# Patient Record
Sex: Male | Born: 1985 | Race: Black or African American | Hispanic: No | Marital: Single | Smoking: Never smoker
Health system: Southern US, Community
[De-identification: ages and names within clinical notes are randomized; demographics above are authoritative.]

## PROBLEM LIST (undated history)

## (undated) DIAGNOSIS — E785 Hyperlipidemia, unspecified: Secondary | ICD-10-CM

## (undated) DIAGNOSIS — F209 Schizophrenia, unspecified: Secondary | ICD-10-CM

## (undated) HISTORY — DX: Schizophrenia, unspecified: F20.9

## (undated) HISTORY — DX: Hyperlipidemia, unspecified: E78.5

---
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
## 2006-06-04 DIAGNOSIS — E785 Hyperlipidemia, unspecified: Secondary | ICD-10-CM | POA: Insufficient documentation

## 2006-06-04 DIAGNOSIS — F209 Schizophrenia, unspecified: Secondary | ICD-10-CM | POA: Insufficient documentation

## 2006-06-04 DIAGNOSIS — E669 Obesity, unspecified: Secondary | ICD-10-CM

## 2013-04-07 DIAGNOSIS — E785 Hyperlipidemia, unspecified: Secondary | ICD-10-CM

## 2013-04-07 HISTORY — DX: Hyperlipidemia, unspecified: E78.5

## 2013-12-23 ENCOUNTER — Ambulatory Visit: Payer: Self-pay | Attending: Internal Medicine

## 2013-12-30 ENCOUNTER — Ambulatory Visit: Payer: Medicaid Other

## 2013-12-30 ENCOUNTER — Encounter: Payer: Self-pay | Admitting: Family Medicine

## 2013-12-30 ENCOUNTER — Ambulatory Visit: Payer: Medicaid Other | Attending: Family Medicine | Admitting: Family Medicine

## 2013-12-30 VITALS — BP 109/72 | HR 73 | Temp 97.7°F | Resp 18 | Ht 63.0 in | Wt 157.0 lb

## 2013-12-30 DIAGNOSIS — F2089 Other schizophrenia: Secondary | ICD-10-CM | POA: Diagnosis not present

## 2013-12-30 DIAGNOSIS — F17200 Nicotine dependence, unspecified, uncomplicated: Secondary | ICD-10-CM

## 2013-12-30 DIAGNOSIS — F209 Schizophrenia, unspecified: Secondary | ICD-10-CM

## 2013-12-30 DIAGNOSIS — E785 Hyperlipidemia, unspecified: Secondary | ICD-10-CM

## 2013-12-30 LAB — CBC
HCT: 43.5 % (ref 39.0–52.0)
Hemoglobin: 15.1 g/dL (ref 13.0–17.0)
MCH: 27.8 pg (ref 26.0–34.0)
MCHC: 34.7 g/dL (ref 30.0–36.0)
MCV: 80 fL (ref 78.0–100.0)
Platelets: 171 10*3/uL (ref 150–400)
RBC: 5.44 MIL/uL (ref 4.22–5.81)
RDW: 13.9 % (ref 11.5–15.5)
WBC: 4.1 10*3/uL (ref 4.0–10.5)

## 2013-12-30 NOTE — Progress Notes (Signed)
Establish Care;

## 2013-12-30 NOTE — Patient Instructions (Addendum)
Mr. Laurich,  Thank you for coming in today. It was a pleasure meeting you. I look forward to being your primary doctor.  1. I have placed a referral to psychiatry. You can also call to the numbers provided. Please call with the doses of your medications.   2. Smoking cessation support: smoking cessation hotline: 1-800-QUIT-NOW.  Smoking cessation classes are available through [facility]. Call [phone] or visit our website at [email or web address].   You will be called with lab results   Dr. Armen Pickup

## 2013-12-30 NOTE — Assessment & Plan Note (Signed)
Referred to psychiatry. Patient's mother is to call in with doses of his medication.

## 2013-12-30 NOTE — Assessment & Plan Note (Signed)
Current smoker not ready to quit. Counseling and smoking cessation resources provided.

## 2013-12-30 NOTE — Progress Notes (Signed)
   Subjective:    Patient ID: RAMZY CAPPELLETTI, male    DOB: [birth year], 28 y.o.   MRN: [id number] CC: Establish care, discussed smoking, referral to psychiatry for schizophrenia HPI 28 year old male with schizophrenia presents with his mother to establish care discussed the following:  #1 smoking: Patient currently smokes half pack per day. Patient's not amenable to cessation. Patient's mother wants him to quit. Patient's stepfather also smokes at home.  #2 schizophrenia: Patient was diagnosed with schizophrenia in his early teens. Patient is on medications but they do not recall the doses. Patient letter for her to psychiatry for continued mental health care. Patient was recently in prison. If he has any was at the psychiatric hospital for treatment of schizophrenia.  Healthcare maintenance: Patient refuses flu shot, Tdap, Pneumovax  Social history: Current smoker Review of Systems As per history of present illness    Objective:   Physical Exam BP 109/72  Pulse 73  Temp(Src) 97.7 F (36.5 C) (Oral)  Resp 18  Ht  (1.6 m)  Wt 157 lb (71.215 kg)  BMI 27.82 kg/m2  SpO2 97% Head: Normocephalic, without obvious abnormality, atraumatic Lungs: clear to auscultation bilaterally Heart: regular rate and rhythm, S1, S2 normal, no murmur, click, rub or gallop Extremities: extremities normal, atraumatic, no cyanosis or edema     Assessment & Plan:

## 2013-12-31 LAB — LIPID PANEL
Cholesterol: 181 mg/dL (ref 0–200)
HDL: 38 mg/dL — ABNORMAL LOW (ref >39)
LDL CALC: 106 mg/dL — AB (ref 0–99)
Total CHOL/HDL Ratio: 4.8 Ratio
Triglycerides: 184 mg/dL — ABNORMAL HIGH (ref <150)
VLDL: 37 mg/dL (ref 0–40)

## 2013-12-31 LAB — COMPLETE METABOLIC PANEL WITH GFR
ALT: 49 U/L (ref 0–53)
AST: 50 U/L — ABNORMAL HIGH (ref 0–37)
Albumin: 4 g/dL (ref 3.5–5.2)
Alkaline Phosphatase: 35 U/L — ABNORMAL LOW (ref 39–117)
BUN: 8 mg/dL (ref 6–23)
CO2: 27 mEq/L (ref 19–32)
Calcium: 9.2 mg/dL (ref 8.4–10.5)
Chloride: 104 mEq/L (ref 96–112)
Creat: 0.78 mg/dL (ref 0.50–1.35)
GFR, Est African American: >89 mL/min
GFR, Est Non African American: >89 mL/min
Glucose, Bld: 72 mg/dL (ref 70–99)
Potassium: 4.2 mEq/L (ref 3.5–5.3)
Sodium: 139 mEq/L (ref 135–145)
Total Bilirubin: 0.9 mg/dL (ref 0.2–1.2)
Total Protein: 7.3 g/dL (ref 6.0–8.3)

## 2014-01-04 ENCOUNTER — Telehealth: Payer: Self-pay | Admitting: *Deleted

## 2014-01-04 NOTE — Telephone Encounter (Signed)
Message copied by Dyann KiefGIRALDEZ, Waller Marcussen M on Wed Jan 04, 2014  5:30 PM ------      Message from: Dessa PhiFUNCHES, JOSALYN      Created: Tue Jan 03, 2014  2:23 PM       Normal CMP, CBC      Elevated cholesterol low fat, low carb, high fiber diet and regular exercise. ------

## 2014-01-04 NOTE — Telephone Encounter (Signed)
Left message with normal labs and advice to return call for more infromation

## 2014-04-28 ENCOUNTER — Encounter (HOSPITAL_COMMUNITY): Payer: Self-pay | Admitting: *Deleted

## 2014-04-28 ENCOUNTER — Emergency Department (HOSPITAL_COMMUNITY)
Admission: EM | Admit: 2014-04-28 | Discharge: 2014-04-28 | Disposition: A | Discharge status: Home / Self Care | Payer: Medicaid Other | Attending: Emergency Medicine | Admitting: Emergency Medicine

## 2014-04-28 DIAGNOSIS — Z7951 Long term (current) use of inhaled steroids: Secondary | ICD-10-CM | POA: Insufficient documentation

## 2014-04-28 DIAGNOSIS — J029 Acute pharyngitis, unspecified: Secondary | ICD-10-CM | POA: Diagnosis present

## 2014-04-28 DIAGNOSIS — J069 Acute upper respiratory infection, unspecified: Secondary | ICD-10-CM | POA: Diagnosis not present

## 2014-04-28 LAB — RAPID STREP SCREEN (MED CTR MEBANE ONLY): Streptococcus, Group A Screen (Direct): NEGATIVE

## 2014-04-28 MED ORDER — ALBUTEROL SULFATE HFA 108 (90 BASE) MCG/ACT IN AERS
2.0000 | INHALATION_SPRAY | Freq: Once | RESPIRATORY_TRACT | Status: AC
  Administered 2014-04-28: 2 via RESPIRATORY_TRACT
  Filled 2014-04-28: qty 6.7

## 2014-04-28 MED ORDER — FLUTICASONE PROPIONATE 50 MCG/ACT NA SUSP: 2.0000 | Freq: Every day | NASAL | Status: DC

## 2014-04-28 NOTE — ED Provider Notes (Signed)
CSN: [id number]     Arrival date & time 04/28/14  2158 History   First MD Initiated Contact with Patient 04/28/14 2200     Chief Complaint  Patient presents with  . Sore Throat     (Consider location/radiation/quality/duration/timing/severity/associated sxs/prior Treatment) HPI Comments: 29 year old male presenting [facility] the homeless shelter complaining of sore throat and "difficulty breathing through his nose" 1 week. States he's had a dry cough that is worse when he goes into the cold. No fevers or wheezing. He has not tried any alleviating factors for his symptoms. He is not sure if anyone else at the homeless shelter is sick.  Patient is a 29 y.o. male presenting with pharyngitis. The history is provided by the patient.  Sore Throat Associated symptoms include congestion, coughing and a sore throat.    History reviewed. No pertinent past medical history. History reviewed. No pertinent past surgical history. History reviewed. No pertinent family history. History  Substance Use Topics  . Smoking status: Not on file  . Smokeless tobacco: Not on file  . Alcohol Use: Yes    Review of Systems  HENT: Positive for congestion and sore throat.   Respiratory: Positive for cough.   All other systems reviewed and are negative.     Allergies  Review of patient's allergies indicates no known allergies.  Home Medications   Prior to Admission medications   Medication Sig Start Date End Date Taking? Authorizing Provider  fluticasone (FLONASE) 50 MCG/ACT nasal spray Place 2 sprays into both nostrils daily. 04/28/14   Demica Zook M Laquinda Moller, PA-C   BP 124/80 mmHg  Pulse 70  Temp(Src) 98.1 F (36.7 C) (Oral)  Resp 16  SpO2 97% Physical Exam  Constitutional: He is oriented to person, place, and time. He appears well-developed and well-nourished. No distress.  HENT:  Head: Normocephalic and atraumatic.  Post oropharyngeal erythema without edema or exiting. Posterior. Nasal mucosal  edema. No sinus tenderness.  Eyes: Conjunctivae are normal.  Neck: Normal range of motion. Neck supple.  Cardiovascular: Normal rate, regular rhythm and normal heart sounds.   Pulmonary/Chest: Effort normal and breath sounds normal. He has no wheezes.  Musculoskeletal: Normal range of motion. He exhibits no edema.  Lymphadenopathy:    He has no cervical adenopathy.  Neurological: He is alert and oriented to person, place, and time.  Skin: Skin is warm and dry. He is not diaphoretic.  Psychiatric: He has a normal mood and affect. His behavior is normal.  Nursing note and vitals reviewed.   ED Course  Procedures (including critical care time) Labs Review Labs Reviewed  RAPID STREP SCREEN  CULTURE, GROUP A STREP    Imaging Review No results found.   EKG Interpretation None      MDM   Final diagnoses:  URI (upper respiratory infection)   Patient in no apparent distress. Afebrile, vital signs stable. Lungs clear. Discussed symptomatic treatment. Albuterol inhaler given, prescription for Flonase. Advised salt water gargles, nasal saline and coolness to affairs. Stable for discharge. Return precautions given. Patient states understanding of treatment care plan and is agreeable.  Kathrynn SpeedRobyn M Jakorian Marengo, PA-C 04/28/14 2245  Geoffery Lyonsouglas Delo, MD 04/28/14 2253

## 2014-04-28 NOTE — ED Notes (Signed)
Bed: [id number] Expected date:  Expected time:  Means of arrival:  Comments: 24M Sore throat

## 2014-04-28 NOTE — Discharge Instructions (Signed)
Use albuterol inhaler every 4-6 hours as needed for cough. Use nasal spray as directed along with nasal saline and salt water gargles.  Upper Respiratory Infection, Adult An upper respiratory infection (URI) is also sometimes known as the common cold. The upper respiratory tract includes the nose, sinuses, throat, trachea, and bronchi. Bronchi are the airways leading to the lungs. Most people improve within 1 week, but symptoms can last up to 2 weeks. A residual cough may last even longer.  CAUSES Many different viruses can infect the tissues lining the upper respiratory tract. The tissues become irritated and inflamed and often become very moist. Mucus production is also common. A cold is contagious. You can easily spread the virus to others by oral contact. This includes kissing, sharing a glass, coughing, or sneezing. Touching your mouth or nose and then touching a surface, which is then touched by another person, can also spread the virus. SYMPTOMS  Symptoms typically develop 1 to 3 days after you come in contact with a cold virus. Symptoms vary from person to person. They may include:  Runny nose.  Sneezing.  Nasal congestion.  Sinus irritation.  Sore throat.  Loss of voice (laryngitis).  Cough.  Fatigue.  Muscle aches.  Loss of appetite.  Headache.  Low-grade fever. DIAGNOSIS  You might diagnose your own cold based on familiar symptoms, since most people get a cold 2 to 3 times a year. Your caregiver can confirm this based on your exam. Most importantly, your caregiver can check that your symptoms are not due to another disease such as strep throat, sinusitis, pneumonia, asthma, or epiglottitis. Blood tests, throat tests, and X-rays are not necessary to diagnose a common cold, but they may sometimes be helpful in excluding other more serious diseases. Your caregiver will decide if any further tests are required. RISKS AND COMPLICATIONS  You may be at risk for a more severe  case of the common cold if you smoke cigarettes, have chronic heart disease (such as heart failure) or lung disease (such as asthma), or if you have a weakened immune system. The very young and very old are also at risk for more serious infections. Bacterial sinusitis, middle ear infections, and bacterial pneumonia can complicate the common cold. The common cold can worsen asthma and chronic obstructive pulmonary disease (COPD). Sometimes, these complications can require emergency medical care and may be life-threatening. PREVENTION  The best way to protect against getting a cold is to practice good hygiene. Avoid oral or hand contact with people with cold symptoms. Wash your hands often if contact occurs. There is no clear evidence that vitamin C, vitamin E, echinacea, or exercise reduces the chance of developing a cold. However, it is always recommended to get plenty of rest and practice good nutrition. TREATMENT  Treatment is directed at relieving symptoms. There is no cure. Antibiotics are not effective, because the infection is caused by a virus, not by bacteria. Treatment may include:  Increased fluid intake. Sports drinks offer valuable electrolytes, sugars, and fluids.  Breathing heated mist or steam (vaporizer or shower).  Eating chicken soup or other clear broths, and maintaining good nutrition.  Getting plenty of rest.  Using gargles or lozenges for comfort.  Controlling fevers with ibuprofen or acetaminophen as directed by your caregiver.  Increasing usage of your inhaler if you have asthma. Zinc gel and zinc lozenges, taken in the first 24 hours of the common cold, can shorten the duration and lessen the severity of symptoms. Pain medicines  may help with fever, muscle aches, and throat pain. A variety of non-prescription medicines are available to treat congestion and runny nose. Your caregiver can make recommendations and may suggest nasal or lung inhalers for other symptoms.  HOME  CARE INSTRUCTIONS   Only take over-the-counter or prescription medicines for pain, discomfort, or fever as directed by your caregiver.  Use a warm mist humidifier or inhale steam from a shower to increase air moisture. This may keep secretions moist and make it easier to breathe.  Drink enough water and fluids to keep your urine clear or pale yellow.  Rest as needed.  Return to work when your temperature has returned to normal or as your caregiver advises. You may need to stay home longer to avoid infecting others. You can also use a face mask and careful hand washing to prevent spread of the virus. SEEK MEDICAL CARE IF:   After the first few days, you feel you are getting worse rather than better.  You need your caregiver's advice about medicines to control symptoms.  You develop chills, worsening shortness of breath, or brown or red sputum. These may be signs of pneumonia.  You develop yellow or brown nasal discharge or pain in the face, especially when you bend forward. These may be signs of sinusitis.  You develop a fever, swollen neck glands, pain with swallowing, or white areas in the back of your throat. These may be signs of strep throat. SEEK IMMEDIATE MEDICAL CARE IF:   You have a fever.  You develop severe or persistent headache, ear pain, sinus pain, or chest pain.  You develop wheezing, a prolonged cough, cough up blood, or have a change in your usual mucus (if you have chronic lung disease).  You develop sore muscles or a stiff neck. Document Released: 09/17/2000 Document Revised: 06/16/2011 Document Reviewed: 06/29/2013 [facility] Patient Information 2015 [place], [place]. This information is not intended to replace advice given to you by your health care provider. Make sure you discuss any questions you have with your health care provider.  Cough, Adult  A cough is a reflex that helps clear your throat and airways. It can help heal the body or may be a reaction to an  irritated airway. A cough may only last 2 or 3 weeks (acute) or may last more than 8 weeks (chronic).  CAUSES Acute cough:  Viral or bacterial infections. Chronic cough:  Infections.  Allergies.  Asthma.  Post-nasal drip.  Smoking.  Heartburn or acid reflux.  Some medicines.  Chronic lung problems (COPD).  Cancer. SYMPTOMS   Cough.  Fever.  Chest pain.  Increased breathing rate.  High-pitched whistling sound when breathing (wheezing).  Colored mucus that you cough up (sputum). TREATMENT   A bacterial cough may be treated with antibiotic medicine.  A viral cough must run its course and will not respond to antibiotics.  Your caregiver may recommend other treatments if you have a chronic cough. HOME CARE INSTRUCTIONS   Only take over-the-counter or prescription medicines for pain, discomfort, or fever as directed by your caregiver. Use cough suppressants only as directed by your caregiver.  Use a cold steam vaporizer or humidifier in your bedroom or home to help loosen secretions.  Sleep in a semi-upright position if your cough is worse at night.  Rest as needed.  Stop smoking if you smoke. SEEK IMMEDIATE MEDICAL CARE IF:   You have pus in your sputum.  Your cough starts to worsen.  You cannot control your  cough with suppressants and are losing sleep.  You begin coughing up blood.  You have difficulty breathing.  You develop pain which is getting worse or is uncontrolled with medicine.  You have a fever. MAKE SURE YOU:   Understand these instructions.  Will watch your condition.  Will get help right away if you are not doing well or get worse. Document Released: 09/20/2010 Document Revised: 06/16/2011 Document Reviewed: 09/20/2010 [facility] Patient Information 2015 [place], [place]. This information is not intended to replace advice given to you by your health care provider. Make sure you discuss any questions you have with your health care  provider.

## 2014-04-28 NOTE — ED Notes (Signed)
Patient is alert and oriented x3.  He was given DC instructions and follow up visit instructions.  Patient gave verbal understanding.  He was DC ambulatory under his own power to home.  V/S stable.  He was not showing any signs of distress on DC 

## 2014-04-28 NOTE — ED Notes (Signed)
EDP at bedside  

## 2014-04-28 NOTE — ED Notes (Addendum)
[facility] due to c/o sore throat x 1 week Patient ambulatory from EMS bay without difficulty--steady gait Patient arrives alert and oriented x 4 Patient able to speak in full complete sentences without difficulty--handles secretions RR WNL--even and unlabored with equal rise and fall of chest Patient in NAD

## 2014-04-30 LAB — CULTURE, GROUP A STREP

## 2014-05-01 ENCOUNTER — Encounter: Payer: Self-pay | Admitting: Family Medicine

## 2015-05-22 DIAGNOSIS — Z139 Encounter for screening, unspecified: Secondary | ICD-10-CM

## 2015-06-04 NOTE — Congregational Nurse Program (Signed)
Congregational Nurse Program Note  Date of Encounter: 05/22/2015  Past Medical History: Past Medical History  Diagnosis Date  . Schizophrenia 1996     Encounter Details:     CNP Questionnaire - 05/22/15 2009    Patient Demographics   Is this a new or existing patient? New   Patient is considered a/an Not Applicable   Race African-American/Black   Patient Assistance   Location of Patient Assistance Not Applicable   Patient's financial/insurance status Low Income;Self-Pay   Uninsured Patient Yes   Interventions Counseled to make appt. with provider   Patient referred to apply for the following financial assistance [employer] assistance No   Assistance securing medications No   Educational health offerings Acute disease;Chronic disease;Hypertension   Encounter Details   Primary purpose of visit Education/Health Concerns   Was an Emergency Department visit averted? Not Applicable   Does patient have a medical provider? No   Patient referred to Clinic   Was a mental health screening completed? (GAINS tool) No   Does patient have dental issues? No   Does patient have vision issues? No   Since previous encounter, have you referred patient for abnormal blood pressure that resulted in a new diagnosis or medication change? No   Since previous encounter, have you referred patient for abnormal blood glucose that resulted in a new diagnosis or medication change? No   For Abstraction Use Only   Does patient have insurance? No       B/P check

## 2015-07-17 ENCOUNTER — Emergency Department (HOSPITAL_COMMUNITY)
Admission: EM | Admit: 2015-07-17 | Discharge: 2015-07-17 | Disposition: A | Discharge status: Home / Self Care | Payer: Medicaid Other | Attending: Emergency Medicine | Admitting: Emergency Medicine

## 2015-07-17 ENCOUNTER — Encounter (HOSPITAL_COMMUNITY): Payer: Self-pay | Admitting: *Deleted

## 2015-07-17 DIAGNOSIS — X58XXXA Exposure to other specified factors, initial encounter: Secondary | ICD-10-CM | POA: Insufficient documentation

## 2015-07-17 DIAGNOSIS — F209 Schizophrenia, unspecified: Secondary | ICD-10-CM | POA: Insufficient documentation

## 2015-07-17 DIAGNOSIS — Z79899 Other long term (current) drug therapy: Secondary | ICD-10-CM | POA: Diagnosis not present

## 2015-07-17 DIAGNOSIS — Y9389 Activity, other specified: Secondary | ICD-10-CM | POA: Insufficient documentation

## 2015-07-17 DIAGNOSIS — Y998 Other external cause status: Secondary | ICD-10-CM | POA: Diagnosis not present

## 2015-07-17 DIAGNOSIS — S3121XA Laceration without foreign body of penis, initial encounter: Secondary | ICD-10-CM | POA: Diagnosis present

## 2015-07-17 DIAGNOSIS — Y9289 Other specified places as the place of occurrence of the external cause: Secondary | ICD-10-CM | POA: Insufficient documentation

## 2015-07-17 DIAGNOSIS — Z7951 Long term (current) use of inhaled steroids: Secondary | ICD-10-CM | POA: Insufficient documentation

## 2015-07-17 MED ORDER — BACITRACIN ZINC 500 UNIT/GM EX OINT: 1.0000 "application " | TOPICAL_OINTMENT | Freq: Two times a day (BID) | CUTANEOUS | Status: DC

## 2015-07-17 MED ORDER — BACITRACIN ZINC 500 UNIT/GM EX OINT: TOPICAL_OINTMENT | Freq: Two times a day (BID) | CUTANEOUS | Status: DC

## 2015-07-17 NOTE — ED Notes (Signed)
Patient able to ambulate independently  
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
## 2015-07-17 NOTE — Discharge Instructions (Signed)
Read the information below.  Use the prescribed medication as directed.  Please discuss all new medications with your pharmacist.  You may return to the Emergency Department at any time for worsening condition or any new symptoms that concern you.    If you develop redness, swelling, pus draining from the wound, or fevers greater than 100.4, return to the ER immediately for a recheck.   Keep the area clean with soap and water only, do not use alcohol or hydrogen peroxide.  Use a thin layer of antibiotic ointment twice daily while healing.     Laceration Care, Adult A laceration is a cut that goes through all layers of the skin. The cut also goes into the tissue that is right under the skin. Some cuts heal on their own. Others need to be closed with stitches (sutures), staples, skin adhesive strips, or wound glue. Taking care of your cut lowers your risk of infection and helps your cut to heal better. HOW TO TAKE CARE OF YOUR CUT For stitches or staples:  Keep the wound clean and dry.  If you were given a bandage (dressing), you should change it at least one time per day or as told by your doctor. You should also change it if it gets wet or dirty.  Keep the wound completely dry for the first 24 hours or as told by your doctor. After that time, you may take a shower or a bath. However, make sure that the wound is not soaked in water until after the stitches or staples have been removed.  Clean the wound one time each day or as told by your doctor:  Wash the wound with soap and water.  Rinse the wound with water until all of the soap comes off.  Pat the wound dry with a clean towel. Do not rub the wound.  After you clean the wound, put a thin layer of antibiotic ointment on it as told by your doctor. This ointment:  Helps to prevent infection.  Keeps the bandage from sticking to the wound.  Have your stitches or staples removed as told by your doctor. If your doctor used skin adhesive  strips:   Keep the wound clean and dry.  If you were given a bandage, you should change it at least one time per day or as told by your doctor. You should also change it if it gets dirty or wet.  Do not get the skin adhesive strips wet. You can take a shower or a bath, but be careful to keep the wound dry.  If the wound gets wet, pat it dry with a clean towel. Do not rub the wound.  Skin adhesive strips fall off on their own. You can trim the strips as the wound heals. Do not remove any strips that are still stuck to the wound. They will fall off after a while. If your doctor used wound glue:  Try to keep your wound dry, but you may briefly wet it in the shower or bath. Do not soak the wound in water, such as by swimming.  After you take a shower or a bath, gently pat the wound dry with a clean towel. Do not rub the wound.  Do not do any activities that will make you really sweaty until the skin glue has fallen off on its own.  Do not apply liquid, cream, or ointment medicine to your wound while the skin glue is still on.  If you were given  a bandage, you should change it at least one time per day or as told by your doctor. You should also change it if it gets dirty or wet.  If a bandage is placed over the wound, do not let the tape for the bandage touch the skin glue.  Do not pick at the glue. The skin glue usually stays on for 5-10 days. Then, it falls off of the skin. General Instructions  To help prevent scarring, make sure to cover your wound with sunscreen whenever you are outside after stitches are removed, after adhesive strips are removed, or when wound glue stays in place and the wound is healed. Make sure to wear a sunscreen of at least 30 SPF.  Take over-the-counter and prescription medicines only as told by your doctor.  If you were given antibiotic medicine or ointment, take or apply it as told by your doctor. Do not stop using the antibiotic even if your wound is  getting better.  Do not scratch or pick at the wound.  Keep all follow-up visits as told by your doctor. This is important.  Check your wound every day for signs of infection. Watch for:  Redness, swelling, or pain.  Fluid, blood, or pus.  Raise (elevate) the injured area above the level of your heart while you are sitting or lying down, if possible. GET HELP IF:  You got a tetanus shot and you have any of these problems at the injection site:  Swelling.  Very bad pain.  Redness.  Bleeding.  You have a fever.  A wound that was closed breaks open.  You notice a bad smell coming from your wound or your bandage.  You notice something coming out of the wound, such as wood or glass.  Medicine does not help your pain.  You have more redness, swelling, or pain at the site of your wound.  You have fluid, blood, or pus coming from your wound.  You notice a change in the color of your skin near your wound.  You need to change the bandage often because fluid, blood, or pus is coming from the wound.  You start to have a new rash.  You start to have numbness around the wound. GET HELP RIGHT AWAY IF:  You have very bad swelling around the wound.  Your pain suddenly gets worse and is very bad.  You notice painful lumps near the wound or on skin that is anywhere on your body.  You have a red streak going away from your wound.  The wound is on your hand or foot and you cannot move a finger or toe like you usually can.  The wound is on your hand or foot and you notice that your fingers or toes look pale or bluish.   This information is not intended to replace advice given to you by your health care provider. Make sure you discuss any questions you have with your health care provider.   Document Released: 09/10/2007 Document Revised: 08/08/2014 Document Reviewed: 03/20/2014 Elsevier Interactive Patient Education [employer]
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
## 2015-09-11 ENCOUNTER — Encounter: Payer: Self-pay | Admitting: Family Medicine

## 2015-09-11 ENCOUNTER — Ambulatory Visit: Payer: Medicaid Other | Attending: Family Medicine | Admitting: Family Medicine

## 2015-09-11 VITALS — BP 105/69 | HR 59 | Temp 98.4°F | Resp 16 | Ht 66.5 in | Wt 179.0 lb

## 2015-09-11 DIAGNOSIS — E785 Hyperlipidemia, unspecified: Secondary | ICD-10-CM | POA: Diagnosis not present

## 2015-09-11 DIAGNOSIS — F209 Schizophrenia, unspecified: Secondary | ICD-10-CM

## 2015-09-11 DIAGNOSIS — J302 Other seasonal allergic rhinitis: Secondary | ICD-10-CM

## 2015-09-11 DIAGNOSIS — Z79899 Other long term (current) drug therapy: Secondary | ICD-10-CM | POA: Diagnosis not present

## 2015-09-11 DIAGNOSIS — Z114 Encounter for screening for human immunodeficiency virus [HIV]: Secondary | ICD-10-CM

## 2015-09-11 DIAGNOSIS — Z23 Encounter for immunization: Secondary | ICD-10-CM

## 2015-09-11 DIAGNOSIS — R0789 Other chest pain: Secondary | ICD-10-CM

## 2015-09-11 LAB — CBC
HCT: 45.4 % (ref 38.5–50.0)
HEMOGLOBIN: 15.3 g/dL (ref 13.2–17.1)
MCH: 27.4 pg (ref 27.0–33.0)
MCHC: 33.7 g/dL (ref 32.0–36.0)
MCV: 81.2 fL (ref 80.0–100.0)
MPV: 11.5 fL (ref 7.5–12.5)
Platelets: 158 10*3/uL (ref 140–400)
RBC: 5.59 MIL/uL (ref 4.20–5.80)
RDW: 14.5 % (ref 11.0–15.0)
WBC: 4.7 10*3/uL (ref 3.8–10.8)

## 2015-09-11 MED ORDER — CETIRIZINE HCL 10 MG PO TABS: 10.0000 mg | ORAL_TABLET | Freq: Every day | ORAL | Status: DC

## 2015-09-11 MED FILL — ALL DAY ALLERGY 10 MG TAB: 10 | 30 days supply | Qty: 30 | Fill #0
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
## 2015-09-11 NOTE — Progress Notes (Signed)
Annual physical  Pt non fasting, requesting lipid check  No pain today  No tobacco user  No suicidal thoughts in the past two weeks

## 2015-09-11 NOTE — Patient Instructions (Addendum)
Jorge Bentley was seen today for annual exam.  Diagnoses and all orders for this visit:  Hyperlipidemia -     Lipid Panel  Schizophrenia, unspecified type (HCC) -     CBC -     COMPLETE METABOLIC PANEL WITH GFR -     Valproic acid level  Screening for HIV (human immunodeficiency virus) -     HIV antibody (with reflex)  Left-sided chest wall pain -     DG Chest 2 View; Standing -     DG Chest 2 View   You will be contacted with your lab results  F/u in one year sooner if needed   Dr. Armen PickupFunches

## 2015-09-12 LAB — COMPLETE METABOLIC PANEL WITH GFR
ALBUMIN: 4.3 g/dL (ref 3.6–5.1)
ALK PHOS: 27 U/L — AB (ref 40–115)
ALT: 16 U/L (ref 9–46)
AST: 22 U/L (ref 10–40)
BILIRUBIN TOTAL: 1.2 mg/dL (ref 0.2–1.2)
BUN: 11 mg/dL (ref 7–25)
CO2: 29 mmol/L (ref 20–31)
CREATININE: 0.82 mg/dL (ref 0.60–1.35)
Calcium: 9.3 mg/dL (ref 8.6–10.3)
Chloride: 104 mmol/L (ref 98–110)
GFR, Est African American: >89 mL/min (ref >=60)
GFR, Est Non African American: >89 mL/min (ref >=60)
GLUCOSE: 80 mg/dL (ref 65–99)
Potassium: 4.7 mmol/L (ref 3.5–5.3)
SODIUM: 139 mmol/L (ref 135–146)
TOTAL PROTEIN: 6.8 g/dL (ref 6.1–8.1)

## 2015-09-12 LAB — VALPROIC ACID LEVEL: Valproic Acid Lvl: <12.5 ug/mL — ABNORMAL LOW (ref 50.0–100.0)

## 2015-09-12 LAB — LIPID PANEL
Cholesterol: 180 mg/dL (ref 125–200)
HDL: 55 mg/dL (ref >=40)
LDL CALC: 108 mg/dL (ref <130)
Total CHOL/HDL Ratio: 3.3 Ratio (ref <=5.0)
Triglycerides: 87 mg/dL (ref <150)
VLDL: 17 mg/dL (ref <30)

## 2015-09-12 LAB — HIV ANTIBODY (ROUTINE TESTING W REFLEX): HIV 1&2 Ab, 4th Generation: NONREACTIVE

## 2015-09-24 ENCOUNTER — Telehealth: Payer: Self-pay | Admitting: *Deleted

## 2015-09-24 NOTE — Telephone Encounter (Signed)
-----   Message from Dessa PhiJosalyn Funches, MD sent at 09/13/2015  8:15 AM EDT ----- All labs normal Patient valproic acid  level is low because he is not taking it  Referred to psychiatry for schizophrenia.

## 2015-09-24 NOTE — Telephone Encounter (Signed)
LVM to return call.

## 2016-08-16 ENCOUNTER — Encounter (HOSPITAL_COMMUNITY): Payer: Self-pay

## 2016-08-16 ENCOUNTER — Emergency Department (HOSPITAL_COMMUNITY)
Admission: EM | Admit: 2016-08-16 | Discharge: 2016-08-16 | Disposition: A | Discharge status: Home / Self Care | Payer: Medicaid Other | Attending: Emergency Medicine | Admitting: Emergency Medicine

## 2016-08-16 DIAGNOSIS — F10920 Alcohol use, unspecified with intoxication, uncomplicated: Secondary | ICD-10-CM

## 2016-08-16 DIAGNOSIS — F10120 Alcohol abuse with intoxication, uncomplicated: Secondary | ICD-10-CM | POA: Diagnosis present

## 2016-08-16 DIAGNOSIS — Z79899 Other long term (current) drug therapy: Secondary | ICD-10-CM | POA: Diagnosis not present

## 2016-08-16 NOTE — Discharge Instructions (Signed)
Please read and follow all provided instructions.  Your diagnoses today include:  1. Alcoholic intoxication without complication (HCC)     Tests performed today include: Vital signs. See below for your results today.   Medications prescribed:  Take as prescribed   Home care instructions:  Follow any educational materials contained in this packet.  Follow-up instructions: Please follow-up with your primary care provider for further evaluation of symptoms and treatment   Return instructions:  Please return to the Emergency Department if you do not get better, if you get worse, or new symptoms OR  - Fever (temperature greater than 101.67F)  - Bleeding that does not stop with holding pressure to the area    -Severe pain (please note that you may be more sore the day after your accident)  - Chest Pain  - Difficulty breathing  - Severe nausea or vomiting  - Inability to tolerate food and liquids  - Passing out  - Skin becoming red around your wounds  - Change in mental status (confusion or lethargy)  - New numbness or weakness    Please return if you have any other emergent concerns.  Additional Information:  Your vital signs today were: BP 108/64 (BP Location: Right Arm)    Pulse 73    Temp 97.8 F (36.6 C) (Oral)    Resp 16    Ht 5\' 7"  (1.702 m)    Wt 79.4 kg    SpO2 97%    BMI 27.41 kg/m  If your blood pressure (BP) was elevated above 135/85 this visit, please have this repeated by your doctor within one month. ---------------

## 2016-08-16 NOTE — ED Notes (Signed)
Bed: [facility] Expected date:  Expected time:  Means of arrival:  Comments: ETOH 
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
## 2016-08-16 NOTE — ED Notes (Signed)
Patient has water at the bedside, but opened eyes only for a few seconds when attempting to awaken.. When more alert and awake will encourage to drink.

## 2016-08-16 NOTE — ED Notes (Signed)
Patient has tolerated 2 sandwiches, peanut butter crackers and juice with no problem.

## 2016-12-23 ENCOUNTER — Emergency Department (HOSPITAL_COMMUNITY)
Admission: EM | Admit: 2016-12-23 | Discharge: 2016-12-24 | Disposition: A | Discharge status: Home / Self Care | Payer: Medicaid Other | Attending: Emergency Medicine | Admitting: Emergency Medicine

## 2016-12-23 ENCOUNTER — Emergency Department (HOSPITAL_COMMUNITY): Payer: Medicaid Other

## 2016-12-23 DIAGNOSIS — S0230XA Fracture of orbital floor, unspecified side, initial encounter for closed fracture: Secondary | ICD-10-CM | POA: Insufficient documentation

## 2016-12-23 DIAGNOSIS — S022XXA Fracture of nasal bones, initial encounter for closed fracture: Secondary | ICD-10-CM | POA: Insufficient documentation

## 2016-12-23 DIAGNOSIS — F209 Schizophrenia, unspecified: Secondary | ICD-10-CM | POA: Diagnosis not present

## 2016-12-23 DIAGNOSIS — S0590XA Unspecified injury of unspecified eye and orbit, initial encounter: Secondary | ICD-10-CM | POA: Diagnosis present

## 2016-12-23 DIAGNOSIS — Z79899 Other long term (current) drug therapy: Secondary | ICD-10-CM | POA: Diagnosis not present

## 2016-12-23 DIAGNOSIS — Y998 Other external cause status: Secondary | ICD-10-CM | POA: Diagnosis not present

## 2016-12-23 DIAGNOSIS — S0285XA Fracture of orbit, unspecified, initial encounter for closed fracture: Secondary | ICD-10-CM

## 2016-12-23 DIAGNOSIS — Y929 Unspecified place or not applicable: Secondary | ICD-10-CM | POA: Insufficient documentation

## 2016-12-23 DIAGNOSIS — Y9389 Activity, other specified: Secondary | ICD-10-CM | POA: Diagnosis not present

## 2016-12-23 DIAGNOSIS — S01112A Laceration without foreign body of left eyelid and periocular area, initial encounter: Secondary | ICD-10-CM

## 2016-12-23 DIAGNOSIS — Z23 Encounter for immunization: Secondary | ICD-10-CM | POA: Insufficient documentation

## 2016-12-23 MED ORDER — LIDOCAINE-EPINEPHRINE (PF) 2 %-1:200000 IJ SOLN
10.0000 mL | Freq: Once | INTRAMUSCULAR | Status: AC
  Administered 2016-12-24: 10 mL
  Filled 2016-12-23: qty 20

## 2016-12-23 MED ORDER — TETANUS-DIPHTH-ACELL PERTUSSIS 5-2.5-18.5 LF-MCG/0.5 IM SUSP
0.5000 mL | Freq: Once | INTRAMUSCULAR | Status: AC
  Administered 2016-12-23: 0.5 mL via INTRAMUSCULAR
  Filled 2016-12-23: qty 0.5
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
## 2016-12-23 NOTE — ED Notes (Signed)
Called pt's name for triage. No one answered. Nurse in triage was notified.

## 2016-12-23 NOTE — ED Notes (Signed)
Wound cleanser to face, remove some of the dried blood. No crepitus around nose and sinus areas. Patient says "blood was squirting out my eye". Right eye swollen, dried blood on lids. This area not cleaned, so as to not start bleeding again.

## 2016-12-23 NOTE — ED Notes (Signed)
Patient transported to CT 

## 2016-12-24 MED ORDER — HYDROCODONE-ACETAMINOPHEN 5-325 MG PO TABS: 1.0000 | ORAL_TABLET | Freq: Four times a day (QID) | ORAL | 0 refills | Status: AC | PRN

## 2016-12-24 NOTE — ED Notes (Signed)
Patient Alert and oriented X4. Stable and ambulatory. Patient verbalized understanding of the discharge instructions.  Patient belongings were taken by the patient.  

## 2016-12-24 NOTE — ED Notes (Signed)
Patient very lethargic, repetitive questioning and easily forgets things.  PA at bedside for lac repair and aware of patient's condition.

## 2016-12-24 NOTE — ED Notes (Signed)
Patient able to walk without assistance.  Patient denies any light headedness or vertigo.  Patient getting dressed to be discharged.

## 2016-12-24 NOTE — Discharge Instructions (Signed)
You need to follow-up with the plastic surgeon listed.  Your sutures need to be removed in 5 days.  Return for worsening symptoms.

## 2016-12-31 ENCOUNTER — Ambulatory Visit: Payer: Medicaid Other | Admitting: Physician Assistant

## 2016-12-31 ENCOUNTER — Encounter: Payer: Self-pay | Admitting: Physician Assistant

## 2016-12-31 VITALS — BP 106/66 | HR 88 | Temp 97.9°F | Wt 181.8 lb

## 2016-12-31 DIAGNOSIS — S0285XD Fracture of orbit, unspecified, subsequent encounter for fracture with routine healing: Secondary | ICD-10-CM

## 2016-12-31 DIAGNOSIS — F209 Schizophrenia, unspecified: Secondary | ICD-10-CM

## 2016-12-31 DIAGNOSIS — E785 Hyperlipidemia, unspecified: Secondary | ICD-10-CM

## 2016-12-31 DIAGNOSIS — F17200 Nicotine dependence, unspecified, uncomplicated: Secondary | ICD-10-CM

## 2016-12-31 DIAGNOSIS — Z4802 Encounter for removal of sutures: Secondary | ICD-10-CM
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
## 2017-10-06 ENCOUNTER — Emergency Department (HOSPITAL_COMMUNITY)
Admission: EM | Admit: 2017-10-06 | Discharge: 2017-10-06 | Disposition: A | Discharge status: Home / Self Care | Payer: Medicaid Other | Attending: Emergency Medicine | Admitting: Emergency Medicine

## 2017-10-06 ENCOUNTER — Encounter (HOSPITAL_COMMUNITY): Payer: Self-pay | Admitting: *Deleted

## 2017-10-06 ENCOUNTER — Other Ambulatory Visit: Payer: Self-pay

## 2017-10-06 DIAGNOSIS — R369 Urethral discharge, unspecified: Secondary | ICD-10-CM | POA: Insufficient documentation

## 2017-10-06 DIAGNOSIS — R300 Dysuria: Secondary | ICD-10-CM | POA: Diagnosis not present

## 2017-10-06 LAB — URINALYSIS, ROUTINE W REFLEX MICROSCOPIC
Bilirubin Urine: NEGATIVE
GLUCOSE, UA: NEGATIVE mg/dL
Ketones, ur: NEGATIVE mg/dL
Nitrite: NEGATIVE
PROTEIN: NEGATIVE mg/dL
Specific Gravity, Urine: 1.023 (ref 1.005–1.030)
WBC, UA: >50 WBC/hpf — ABNORMAL HIGH (ref 0–5)
pH: 6 (ref 5.0–8.0)

## 2017-10-06 LAB — HIV ANTIBODY (ROUTINE TESTING W REFLEX): HIV SCREEN 4TH GENERATION: NONREACTIVE

## 2017-10-06 LAB — RPR: RPR Ser Ql: NONREACTIVE

## 2017-10-06 MED ORDER — LIDOCAINE 5 % EX PTCH: 1.0000 | MEDICATED_PATCH | CUTANEOUS | 0 refills | Status: AC

## 2017-10-06 MED ORDER — METHOCARBAMOL 500 MG PO TABS: 500.0000 mg | ORAL_TABLET | Freq: Two times a day (BID) | ORAL | 0 refills | Status: AC

## 2017-10-06 MED ORDER — CEFTRIAXONE SODIUM 250 MG IJ SOLR
250.0000 mg | Freq: Once | INTRAMUSCULAR | Status: AC
  Administered 2017-10-06: 250 mg via INTRAMUSCULAR
  Filled 2017-10-06: qty 250

## 2017-10-06 MED ORDER — LIDOCAINE HCL (PF) 1 % IJ SOLN
INTRAMUSCULAR | Status: AC
  Administered 2017-10-06: 0.9 mL
  Filled 2017-10-06: qty 5

## 2017-10-06 MED ORDER — AZITHROMYCIN 250 MG PO TABS
1000.0000 mg | ORAL_TABLET | Freq: Once | ORAL | Status: AC
  Administered 2017-10-06: 1000 mg via ORAL
  Filled 2017-10-06: qty 4

## 2017-10-06 NOTE — ED Triage Notes (Signed)
Patient c/o pain burning with urination onset several days ago.

## 2017-10-06 NOTE — Discharge Instructions (Addendum)
You have been tested for STDs. Some of these results are still pending. Any abnormalities will be called to you. You have been empirically treated for gonorrhea and chlamydia. This does not mean you necessarily have these diseases, treatment is precautionary. Be sure to follow safe sex practices, including monogamy and/or condom use. All sexual partners must also be notified and treated.  Any future STD testing or treatment [facility] be [facility] at the [facility] or primary care office. For the treatment to be fully effective, avoid all sexual contact for at least 2 weeks after medication administration.  Should symptoms fail to improve within 1 week, follow up with a primary care provider or go to the [facility].  Shoulder pain: Take it easy, but do not lay around too much as this may make any stiffness worse.  Antiinflammatory medications: Take 600 mg of ibuprofen every 6 hours or 440 mg (over the counter dose) to 500 mg (prescription dose) of naproxen every 12 hours for the next 3 days. After this time, these medications may be used as needed for pain. Take these medications with food to avoid upset stomach. Choose only one of these medications, do not take them together.  Tylenol: Should you continue to have additional pain while taking the ibuprofen or naproxen, you may add in tylenol as needed. Your daily total maximum amount of tylenol from all sources should be limited to 4000mg /day for persons without liver problems, or 2000mg /day for those with liver problems. Muscle relaxer: Robaxin is a muscle relaxer and may help loosen stiff muscles. Do not take the Robaxin while driving or performing other dangerous activities.  Lidocaine patches: These are available via either prescription or over-the-counter. The over-the-counter option may be more economical one and are likely just as effective. There are multiple over-the-counter brands, such as Salonpas. Exercises: Be sure to  perform the attached exercises starting with three times a week and working up to performing them daily. This is an essential part of preventing long term problems.  Follow up: Follow up with the orthopedic specialist as soon as possible, ideally within the next two weeks.  Return: Return to the ED should symptoms fail to begin to improve within a week.

## 2017-10-06 NOTE — ED Notes (Signed)
pt aware that another urine sample is needed for a urine culture

## 2017-10-06 NOTE — ED Provider Notes (Addendum)
[facility] EMERGENCY DEPARTMENT Provider Note   CSN: [id number] Arrival date & time: 10/06/17  4098     History   Chief Complaint Chief Complaint  Patient presents with  . Dysuria    HPI Jorge Bentley is a 32 y.o. male.  HPI   Jorge Bentley is a 32 y.o. male, with a history of hyperlipidemia and schizophrenia, presenting to the ED with dysuria and penile discharge over the past several days.  Patient is sexually active 2 male partners. Denies fever/chills, nausea/vomiting, pain with bowel movements, abdominal pain, testicular pain or scrotal swelling, or any other complaints.     Past Medical History:  Diagnosis Date  . Hyperlipidemia 2015  . Schizophrenia [facility]) 1996     Patient Active Problem List   Diagnosis Date Noted  . Smoker 12/30/2013  . Hyperlipidemia 06/04/2006  . OBESITY, NOS 06/04/2006  . Schizophrenia (HCC) 06/04/2006    Past Surgical History:  Procedure Laterality Date  . WISDOM TOOTH EXTRACTION  2000        Home Medications    Prior to Admission medications   Medication Sig Start Date End Date Taking? Authorizing Provider  cetirizine (ZYRTEC) 10 MG tablet Take 1 tablet (10 mg total) by mouth daily. 09/11/15   Funches, Gerilyn Nestle, MD  HYDROcodone-acetaminophen (NORCO/VICODIN) 5-325 MG tablet Take 1-2 tablets by mouth every 6 (six) hours as needed. 12/24/16   Roxy Horseman, PA-C  Oxymetazoline HCl (NASAL SPRAY) 0.05 % SOLN Place 2 drops into the nose daily as needed.    [provider]  Phenylephrine-APAP-Guaifenesin (MUCINEX FAST-MAX) 10-650-400 MG/20ML LIQD Take 20 mLs by mouth every 4 (four) hours as needed.    [provider]    Family History Family History  Problem Relation Age of Onset  . Mental illness Father   . Cancer Maternal Grandmother        unsure   . Diabetes Maternal Grandmother   . Hypertension Maternal Grandfather     Social History Social History   Tobacco Use  . Smoking  status: Never Smoker  . Smokeless tobacco: Never Used  Substance Use Topics  . Alcohol use: Yes  . Drug use: Yes    Types: Marijuana     Allergies   Patient has no known allergies.   Review of Systems Review of Systems  Constitutional: Negative for chills and fever.  Gastrointestinal: Negative for abdominal pain, nausea and vomiting.  Genitourinary: Positive for discharge and dysuria. Negative for hematuria, scrotal swelling and testicular pain.  Musculoskeletal: Negative for back pain.  All other systems reviewed and are negative.    Physical Exam Updated Vital Signs BP 135/77   Pulse 64   Temp 98 F (36.7 C)   Resp 18   SpO2 98%   Physical Exam  Constitutional: He appears well-developed and well-nourished. No distress.  HENT:  Head: Normocephalic and atraumatic.  Eyes: Conjunctivae are normal.  Neck: Neck supple.  Cardiovascular: Normal rate, regular rhythm and intact distal pulses.  Pulmonary/Chest: Effort normal. No respiratory distress.  Abdominal: Soft. There is no tenderness. There is no guarding.  Genitourinary:  Genitourinary Comments: Penis, scrotum, and testicles without swelling, lesions, or tenderness. No penile discharge noted. Cremasteric reflex intact. No inguinal lymphadenopathy. Overall normal male genitalia. RN, Kendal Hymen, served as chaperone during the exam.  Musculoskeletal: He exhibits no edema.  Full range of motion without noted difficulty to the left shoulder.  No tenderness, swelling, bruising, erythema, deformity, or tenderness.   Lymphadenopathy:  He has no cervical adenopathy.  Neurological: He is alert.  Sensation grossly intact to light touch through each of the nerve distributions of the bilateral upper extremities. Abduction and adduction of the fingers intact against resistance. Grip strength equal bilaterally. Supination and pronation intact against resistance. Strength 5/5 through the cardinal directions of the bilateral  wrists. Strength 5/5 with flexion and extension of the bilateral elbows. Patient can touch the thumb to each one of the fingertips without difficulty.   Skin: Skin is warm and dry. Capillary refill takes less than 2 seconds. He is not diaphoretic.  Psychiatric: He has a normal mood and affect. His behavior is normal.  Nursing note and vitals reviewed.    ED Treatments / Results  Labs (all labs ordered are listed, but only abnormal results are displayed) Labs Reviewed  URINALYSIS, ROUTINE W REFLEX MICROSCOPIC - Abnormal; Notable for the following components:      Result Value   APPearance HAZY (*)    Hgb urine dipstick SMALL (*)    Leukocytes, UA LARGE (*)    WBC, UA >50 (*)    Bacteria, UA RARE (*)    All other components within normal limits  URINE CULTURE  RPR  HIV ANTIBODY (ROUTINE TESTING)  GC/CHLAMYDIA PROBE AMP ([place]    EKG None  Radiology No results found.  Procedures Procedures (including critical care time)  Medications Ordered in ED Medications  cefTRIAXone (ROCEPHIN) injection 250 mg (250 mg Intramuscular Given 10/06/17 0739)  azithromycin (ZITHROMAX) tablet 1,000 mg (1,000 mg Oral Given 10/06/17 0738)  lidocaine (PF) (XYLOCAINE) 1 % injection (0.9 mLs  Given 10/06/17 0739)     Initial Impression / Assessment and Plan / ED Course  I have reviewed the triage vital signs and the nursing notes.  Pertinent labs & imaging results that were available during my care of the patient were reviewed by me and considered in my medical decision making (see chart for details).     Patient presents with dysuria and penile discharge.  STD testing obtained and empirically treated for GC/chlamydia.  Safe sex practices discussed.  After patient was put up for discharge, he told the nurse he has been having left shoulder pain for the past year.  Neurovascularly intact.  Full range of motion.  Orthopedic follow-up.  Final Clinical Impressions(s) / ED  Diagnoses   Final diagnoses:  Dysuria  Penile discharge    ED Discharge Orders    None       Concepcion LivingJoy, Jing Howatt C, PA-C 10/06/17 0809    Anselm PancoastJoy, Colsen Modi C, PA-C 10/06/17 [id number]    Linwood DibblesKnapp, Jon, MD 10/07/17 1400

## 2017-10-07 LAB — GC/CHLAMYDIA PROBE AMP (~~LOC~~) NOT AT ARMC
CHLAMYDIA, DNA PROBE: POSITIVE — AB
Neisseria Gonorrhea: POSITIVE — AB

## 2017-10-09 LAB — URINE CULTURE: Culture: 80000 — AB

## 2017-10-10 ENCOUNTER — Telehealth: Payer: Self-pay

## 2017-10-10 NOTE — Telephone Encounter (Signed)
No additional treatment for UC from ED 10/06/17 per Isaac BlissMichael Maccia Pharm D

## 2018-03-01 ENCOUNTER — Emergency Department (HOSPITAL_COMMUNITY): Payer: Medicaid Other

## 2018-03-01 ENCOUNTER — Encounter (HOSPITAL_COMMUNITY): Payer: Self-pay | Admitting: Emergency Medicine

## 2018-03-01 ENCOUNTER — Emergency Department (HOSPITAL_COMMUNITY)
Admission: EM | Admit: 2018-03-01 | Discharge: 2018-03-01 | Disposition: A | Discharge status: Home / Self Care | Payer: Medicaid Other | Attending: Emergency Medicine | Admitting: Emergency Medicine

## 2018-03-01 DIAGNOSIS — R369 Urethral discharge, unspecified: Secondary | ICD-10-CM | POA: Diagnosis not present

## 2018-03-01 DIAGNOSIS — Z113 Encounter for screening for infections with a predominantly sexual mode of transmission: Secondary | ICD-10-CM | POA: Diagnosis not present

## 2018-03-01 DIAGNOSIS — J302 Other seasonal allergic rhinitis: Secondary | ICD-10-CM

## 2018-03-01 DIAGNOSIS — R300 Dysuria: Secondary | ICD-10-CM | POA: Insufficient documentation

## 2018-03-01 DIAGNOSIS — Z79899 Other long term (current) drug therapy: Secondary | ICD-10-CM | POA: Diagnosis not present

## 2018-03-01 DIAGNOSIS — J019 Acute sinusitis, unspecified: Secondary | ICD-10-CM | POA: Insufficient documentation

## 2018-03-01 DIAGNOSIS — R0981 Nasal congestion: Secondary | ICD-10-CM | POA: Diagnosis present

## 2018-03-01 LAB — URINALYSIS, ROUTINE W REFLEX MICROSCOPIC
Bilirubin Urine: NEGATIVE
Glucose, UA: NEGATIVE mg/dL
HGB URINE DIPSTICK: NEGATIVE
Ketones, ur: NEGATIVE mg/dL
LEUKOCYTES UA: NEGATIVE
Nitrite: NEGATIVE
PROTEIN: NEGATIVE mg/dL
Specific Gravity, Urine: 1.017 (ref 1.005–1.030)
pH: 5 (ref 5.0–8.0)

## 2018-03-01 MED ORDER — AZITHROMYCIN 250 MG PO TABS
1000.0000 mg | ORAL_TABLET | Freq: Once | ORAL | Status: AC
  Administered 2018-03-01: 1000 mg via ORAL
  Filled 2018-03-01: qty 4

## 2018-03-01 MED ORDER — AMOXICILLIN-POT CLAVULANATE 875-125 MG PO TABS: 1.0000 | ORAL_TABLET | Freq: Two times a day (BID) | ORAL | 0 refills | Status: AC

## 2018-03-01 MED ORDER — CETIRIZINE HCL 10 MG PO TABS: 10.0000 mg | ORAL_TABLET | Freq: Every day | ORAL | 0 refills | Status: AC

## 2018-03-01 MED ORDER — CEFTRIAXONE SODIUM 250 MG IJ SOLR
250.0000 mg | Freq: Once | INTRAMUSCULAR | Status: AC
  Administered 2018-03-01: 250 mg via INTRAMUSCULAR
  Filled 2018-03-01: qty 250

## 2018-03-01 MED ORDER — LIDOCAINE HCL (PF) 1 % IJ SOLN
INTRAMUSCULAR | Status: AC
  Administered 2018-03-01: 5 mL
  Filled 2018-03-01: qty 5

## 2018-03-01 MED ORDER — FLUTICASONE PROPIONATE 50 MCG/ACT NA SUSP: 2.0000 | Freq: Every day | NASAL | 0 refills | Status: AC

## 2018-03-01 NOTE — ED Notes (Signed)
Pt. To x-ray .

## 2018-03-01 NOTE — ED Notes (Signed)
Pt. Given water per request of PA so pt. Can provide urine sample

## 2018-03-01 NOTE — Discharge Instructions (Signed)
Your nasal congestion and coughing is likely related to a sinus infection, given that your symptoms have been present and not resolving for 2 weeks please take antibiotic as directed.  You can also use Flonase and Zyrtec which are both over-the-counter to help manage her nasal congestion and symptoms and this should help with your breathing.  You can also use sinus rinses which can be purchased at the pharmacy as well.  Your chest x-ray showed no evidence of pneumonia today.  If symptoms are not improving please follow-up with your primary care doctor if you develop severe headache, neck stiffness, fevers or any other new or concerning symptoms return for reevaluation.  You were treated today for gonorrhea and chlamydia which is what is likely causing your discomfort with urination.  You have STD testing pending will be called in 2 to 3 days if any results are positive at which point you should notify any partners, for any further STD testing or treatment needs you can go to the health department.

## 2018-03-01 NOTE — ED Triage Notes (Signed)
Pt states he has had a cold for about two weeks- runny nose, feels congested at night and unable to breathe. Pt has other conditions that he does not want to talk to RN about. Waiting for PA

## 2018-03-01 NOTE — ED Provider Notes (Addendum)
[facility] EMERGENCY DEPARTMENT Provider Note   CSN: [id number] Arrival date & time: 03/01/18  0745     History   Chief Complaint Chief Complaint  Patient presents with  . Nasal Congestion  . Exposure to STD    HPI Jorge Bentley is a 32 y.o. male.  Jorge Bentley is a 32 y.o. Male with a history of schizophrenia and hyperlipidemia, who presents to the ED with multiple complaints.  He reports that for the past 2 weeks ever since he was out in the cold for a long time he has had copious amounts of rhinorrhea and nasal congestion that have not been improving.  He also reports frequent productive cough with mucus.  He feels like the mucus is running from his nose back to his throat and this made his throat sore.  He denies any fevers.  No headache or neck stiffness.  He denies chest pain or shortness of breath but has had frequent cough.  Denies abdominal pain, nausea or vomiting.  Patient also reports that for the past week or so he has had some dysuria and burning and some penile discharge.  He reports he is sexually active and has been with one partner for 4-5 months but is never had the symptoms before.  He denies any flank pain, no blood in the urine.  No penile swelling or testicular pain or swelling and no genital lesions or sores.     Past Medical History:  Diagnosis Date  . Hyperlipidemia 2015  . Schizophrenia [facility]) 1996     Patient Active Problem List   Diagnosis Date Noted  . Smoker 12/30/2013  . Hyperlipidemia 06/04/2006  . OBESITY, NOS 06/04/2006  . Schizophrenia (HCC) 06/04/2006    Past Surgical History:  Procedure Laterality Date  . WISDOM TOOTH EXTRACTION  2000        Home Medications    Prior to Admission medications   Medication Sig Start Date End Date Taking? Authorizing Provider  amoxicillin-clavulanate (AUGMENTIN) 875-125 MG tablet Take 1 tablet by mouth 2 (two) times daily. One po bid x 7 days 03/01/18   Dartha Lodge,  PA-C  cetirizine (ZYRTEC) 10 MG tablet Take 1 tablet (10 mg total) by mouth daily. 03/01/18   Dartha Lodge, PA-C  fluticasone (FLONASE) 50 MCG/ACT nasal spray Place 2 sprays into both nostrils daily. 03/01/18   Dartha Lodge, PA-C  HYDROcodone-acetaminophen (NORCO/VICODIN) 5-325 MG tablet Take 1-2 tablets by mouth every 6 (six) hours as needed. 12/24/16   Roxy Horseman, PA-C  lidocaine (LIDODERM) 5 % Place 1 patch onto the skin daily. Remove & Discard patch within 12 hours or as directed by MD 10/06/17   Joy, Shawn C, PA-C  methocarbamol (ROBAXIN) 500 MG tablet Take 1 tablet (500 mg total) by mouth 2 (two) times daily. 10/06/17   Joy, Shawn C, PA-C  Oxymetazoline HCl (NASAL SPRAY) 0.05 % SOLN Place 2 drops into the nose daily as needed.    [provider]  Phenylephrine-APAP-Guaifenesin (MUCINEX FAST-MAX) 10-650-400 MG/20ML LIQD Take 20 mLs by mouth every 4 (four) hours as needed.    [provider]    Family History Family History  Problem Relation Age of Onset  . Mental illness Father   . Cancer Maternal Grandmother        unsure   . Diabetes Maternal Grandmother   . Hypertension Maternal Grandfather     Social History Social History   Tobacco Use  . Smoking status:  Never Smoker  . Smokeless tobacco: Never Used  Substance Use Topics  . Alcohol use: Yes  . Drug use: Yes    Types: Marijuana     Allergies   Patient has no known allergies.   Review of Systems Review of Systems  Constitutional: Negative for chills and fever.  HENT: Positive for congestion, rhinorrhea, sinus pressure, sinus pain and sore throat. Negative for ear pain, trouble swallowing and voice change.   Eyes: Negative for visual disturbance.  Respiratory: Positive for cough. Negative for chest tightness, shortness of breath and wheezing.   Cardiovascular: Negative for chest pain.  Gastrointestinal: Negative for abdominal pain, nausea and vomiting.  Genitourinary: Positive for discharge,  dysuria and frequency. Negative for flank pain, genital sores, penile pain, penile swelling, scrotal swelling and testicular pain.  Musculoskeletal: Negative for neck pain and neck stiffness.  Skin: Negative for color change and rash.  Neurological: Negative for dizziness, syncope, light-headedness and headaches.     Physical Exam Updated Vital Signs BP 133/75 (BP Location: Right Arm)   Pulse 62   Temp 97.8 F (36.6 C) (Oral)   Resp 18   SpO2 97%   Physical Exam  Constitutional: He appears well-developed and well-nourished. He does not appear ill. No distress.  HENT:  Head: Normocephalic and atraumatic.  Mouth/Throat: Oropharynx is clear and moist.  TMs clear with good landmarks, moderate nasal mucosa edema with clear rhinorrhea, tenderness over the maxillary sinuses bilaterally, posterior oropharynx clear and moist, with some erythema, no edema or exudates, uvula midline  Eyes: Right eye exhibits no discharge. Left eye exhibits no discharge.  Neck: Neck supple.  No rigidity  Cardiovascular: Normal rate, regular rhythm, normal heart sounds and intact distal pulses.  Pulmonary/Chest: Effort normal and breath sounds normal. No respiratory distress.  Respirations equal and unlabored, patient able to speak in full sentences, lungs clear to auscultation bilaterally, occasional cough during exam  Abdominal: Soft. Bowel sounds are normal. He exhibits no distension and no mass. There is no tenderness. There is no guarding.  Abdomen soft, nondistended, nontender to palpation in all quadrants without guarding or peritoneal signs  Genitourinary:  Genitourinary Comments: Chaperone present during genital exam. No inguinal lymphadenopathy and no lesions noted. No penile swelling or tenderness, there is some clear discharge present at the urinary meatus.  There is no testicular tenderness or swelling.  Musculoskeletal: He exhibits no deformity.  Lymphadenopathy:    He has no cervical adenopathy.   Neurological: He is alert. Coordination normal.  Skin: Skin is warm and dry. Capillary refill takes less than 2 seconds. He is not diaphoretic.  Psychiatric: He has a normal mood and affect. His behavior is normal.  Nursing note and vitals reviewed.    ED Treatments / Results  Labs (all labs ordered are listed, but only abnormal results are displayed) Labs Reviewed  URINALYSIS, ROUTINE W REFLEX MICROSCOPIC  RPR  HIV ANTIBODY (ROUTINE TESTING W REFLEX)  GC/CHLAMYDIA PROBE AMP ([place]) NOT AT [facility]    EKG None  Radiology Dg Chest 2 View  Result Date: 03/01/2018 CLINICAL DATA:  Cough and shortness of breath EXAM: CHEST - 2 VIEW COMPARISON:  September 30, 2016 FINDINGS: No edema or consolidation. Heart size and pulmonary vascularity are normal. No adenopathy. No pneumothorax. No bone lesions. IMPRESSION: No edema or consolidation. Electronically Signed   By: Bretta Bang III M.D.   On: 03/01/2018 09:05    Procedures Procedures (including critical care time)  Medications Ordered in ED Medications  cefTRIAXone (  ROCEPHIN) injection 250 mg (250 mg Intramuscular Given 03/01/18 0902)  azithromycin (ZITHROMAX) tablet 1,000 mg (1,000 mg Oral Given 03/01/18 0900)  lidocaine (PF) (XYLOCAINE) 1 % injection (5 mLs  Given 03/01/18 0902)     Initial Impression / Assessment and Plan / ED Course  I have reviewed the triage vital signs and the nursing notes.  Pertinent labs & imaging results that were available during my care of the patient were reviewed by me and considered in my medical decision making (see chart for details).  Presents with multiple complaints including nasal congestion, rhinorrhea, cough that has been persistent for 2 weeks, no associated fevers and vitals are normal patient is well-appearing on arrival.  Lungs are clear, chest x-ray shows no evidence of infiltrate.  Symptoms most consistent with sinusitis, patient has tenderness across both maxillary sinuses with  some pain radiating into the jaw.  No headache, neck pain or stiffness to suggest meningitis but given persistence of symptoms over 2 weeks think it is reasonable to treat with Augmentin, also discussed Flonase and Zyrtec for symptomatic control and encouraged sinus rinses.  Return precautions discussed.  Patient also reports 1 week of dysuria and penile discharge.  No testicular swelling or pain to suggest epididymitis, no rectal pain or pain with defecation and no abdominal pain to suggest prostatitis.  No fevers or chills.  Urinalysis unremarkable, STD testing collected and pending.  Patient prophylactically treated for gonorrhea and chlamydia.  He has STD testing pending will be called in 2 to 3 days with any positive, patient has been instructed to inform any partners of results of the can be tested and treated as well.  He expresses understanding and agreement with plan.  At this time there does not appear to be any evidence of an acute emergency medical condition and the patient appears stable for discharge with appropriate outpatient follow up.Diagnosis was discussed with patient who verbalizes understanding and is agreeable to discharge.  Final Clinical Impressions(s) / ED Diagnoses   Final diagnoses:  Acute sinusitis, recurrence not specified, unspecified location  Penile discharge    ED Discharge Orders         Ordered    amoxicillin-clavulanate (AUGMENTIN) 875-125 MG tablet  2 times daily     03/01/18 0949    cetirizine (ZYRTEC) 10 MG tablet  Daily     03/01/18 0949    fluticasone (FLONASE) 50 MCG/ACT nasal spray  Daily     03/01/18 0949           Dartha LodgeFord, Addelynn Batte N, PA-C 03/01/18 1139    Dartha LodgeFord, Katelynd Blauvelt N, PA-C 03/01/18 1139    Azalia Bilisampos, Kevin, MD 03/01/18 1620

## 2018-03-02 LAB — HIV ANTIBODY (ROUTINE TESTING W REFLEX): HIV Screen 4th Generation wRfx: NONREACTIVE

## 2018-03-02 LAB — RPR: RPR: NONREACTIVE

## 2018-03-02 LAB — GC/CHLAMYDIA PROBE AMP (~~LOC~~) NOT AT ARMC
CHLAMYDIA, DNA PROBE: NEGATIVE
NEISSERIA GONORRHEA: NEGATIVE

## 2018-05-23 ENCOUNTER — Emergency Department (HOSPITAL_COMMUNITY)
Admission: EM | Admit: 2018-05-23 | Discharge: 2018-05-23 | Disposition: A | Discharge status: Home / Self Care | Payer: Medicaid Other | Attending: Emergency Medicine | Admitting: Emergency Medicine

## 2018-05-23 DIAGNOSIS — R112 Nausea with vomiting, unspecified: Secondary | ICD-10-CM

## 2018-05-23 DIAGNOSIS — K859 Acute pancreatitis without necrosis or infection, unspecified: Secondary | ICD-10-CM

## 2018-05-23 DIAGNOSIS — Z79899 Other long term (current) drug therapy: Secondary | ICD-10-CM | POA: Diagnosis not present

## 2018-05-23 DIAGNOSIS — R197 Diarrhea, unspecified: Secondary | ICD-10-CM

## 2018-05-23 DIAGNOSIS — E785 Hyperlipidemia, unspecified: Secondary | ICD-10-CM | POA: Diagnosis not present

## 2018-05-23 LAB — CBC WITH DIFFERENTIAL/PLATELET
Abs Immature Granulocytes: 0.04 10*3/uL (ref 0.00–0.07)
Basophils Absolute: 0 10*3/uL (ref 0.0–0.1)
Basophils Relative: 0 %
EOS ABS: 0.2 10*3/uL (ref 0.0–0.5)
Eosinophils Relative: 2 %
HEMATOCRIT: 50.9 % (ref 39.0–52.0)
Hemoglobin: 16.9 g/dL (ref 13.0–17.0)
Immature Granulocytes: 0 %
Lymphocytes Relative: 3 %
Lymphs Abs: 0.3 10*3/uL — ABNORMAL LOW (ref 0.7–4.0)
MCH: 28 pg (ref 26.0–34.0)
MCHC: 33.2 g/dL (ref 30.0–36.0)
MCV: 84.4 fL (ref 80.0–100.0)
Monocytes Absolute: 0.3 10*3/uL (ref 0.1–1.0)
Monocytes Relative: 3 %
Neutro Abs: 9.2 10*3/uL — ABNORMAL HIGH (ref 1.7–7.7)
Neutrophils Relative %: 92 %
Platelets: 155 10*3/uL (ref 150–400)
RBC: 6.03 MIL/uL — ABNORMAL HIGH (ref 4.22–5.81)
RDW: 13.3 % (ref 11.5–15.5)
WBC: 10 10*3/uL (ref 4.0–10.5)
nRBC: 0 % (ref 0.0–0.2)

## 2018-05-23 LAB — URINALYSIS, ROUTINE W REFLEX MICROSCOPIC
Bilirubin Urine: NEGATIVE
GLUCOSE, UA: NEGATIVE mg/dL
Hgb urine dipstick: NEGATIVE
Ketones, ur: 5 mg/dL — AB
Leukocytes,Ua: NEGATIVE
Nitrite: NEGATIVE
PH: 5 (ref 5.0–8.0)
Protein, ur: NEGATIVE mg/dL
Specific Gravity, Urine: 1.019 (ref 1.005–1.030)

## 2018-05-23 LAB — COMPREHENSIVE METABOLIC PANEL
ALT: 20 U/L (ref 0–44)
AST: 27 U/L (ref 15–41)
Albumin: 4 g/dL (ref 3.5–5.0)
Alkaline Phosphatase: 25 U/L — ABNORMAL LOW (ref 38–126)
Anion gap: 9 (ref 5–15)
BUN: 12 mg/dL (ref 6–20)
CHLORIDE: 105 mmol/L (ref 98–111)
CO2: 25 mmol/L (ref 22–32)
CREATININE: 0.8 mg/dL (ref 0.61–1.24)
Calcium: 8.8 mg/dL — ABNORMAL LOW (ref 8.9–10.3)
GFR calc Af Amer: >60 mL/min (ref >60)
Glucose, Bld: 85 mg/dL (ref 70–99)
Potassium: 4 mmol/L (ref 3.5–5.1)
Sodium: 139 mmol/L (ref 135–145)
Total Bilirubin: 2.9 mg/dL — ABNORMAL HIGH (ref 0.3–1.2)
Total Protein: 7 g/dL (ref 6.5–8.1)

## 2018-05-23 LAB — LIPASE, BLOOD: Lipase: 92 U/L — ABNORMAL HIGH (ref 11–51)

## 2018-05-23 MED ORDER — ONDANSETRON 4 MG PO TBDP: ORAL_TABLET | ORAL | 0 refills | Status: AC

## 2018-05-23 MED ORDER — FAMOTIDINE IN NACL 20-0.9 MG/50ML-% IV SOLN
20.0000 mg | Freq: Once | INTRAVENOUS | Status: AC
  Administered 2018-05-23: 20 mg via INTRAVENOUS
  Filled 2018-05-23: qty 50

## 2018-05-23 MED ORDER — FAMOTIDINE 20 MG PO TABS: 20.0000 mg | ORAL_TABLET | Freq: Two times a day (BID) | ORAL | 0 refills | Status: AC

## 2018-05-23 MED ORDER — ONDANSETRON HCL 4 MG/2ML IJ SOLN
4.0000 mg | Freq: Once | INTRAMUSCULAR | Status: AC
  Administered 2018-05-23: 4 mg via INTRAVENOUS
  Filled 2018-05-23: qty 2

## 2018-05-23 MED ORDER — SODIUM CHLORIDE 0.9 % IV BOLUS
1000.0000 mL | Freq: Once | INTRAVENOUS | Status: AC
  Administered 2018-05-23: 1000 mL via INTRAVENOUS

## 2018-05-23 NOTE — ED Provider Notes (Signed)
[facility]   CSN: [id number] Arrival date & time: 05/23/18  1148     History   Chief Complaint Chief Complaint  Patient presents with  . Emesis    HPI Jorge Bentley is a 33 y.o. male.  Patient is a 33 year old male with a history of schizophrenia and hyperlipidemia who presents with vomiting and diarrhea.  He states he woke up this morning at 6 AM with pain in his epigastric area that radiates to his mid chest.  He had 3-4 episodes of vomiting and several episodes of watery diarrhea.  He denies any hematemesis or blood in stool.  No known fevers.  No cough or URI symptoms.  He has not taken any medications for the symptoms.  No history of prior GI issues.     Past Medical History:  Diagnosis Date  . Hyperlipidemia 2015  . Schizophrenia [facility](HCC) 1996     Patient Active Problem List   Diagnosis Date Noted  . Smoker 12/30/2013  . Hyperlipidemia 06/04/2006  . OBESITY, NOS 06/04/2006  . Schizophrenia (HCC) 06/04/2006    Past Surgical History:  Procedure Laterality Date  . WISDOM TOOTH EXTRACTION  2000        Home Medications    Prior to Admission medications   Medication Sig Start Date End Date Taking? Authorizing Provider  amoxicillin-clavulanate (AUGMENTIN) 875-125 MG tablet Take 1 tablet by mouth 2 (two) times daily. One po bid x 7 days 03/01/18   Dartha LodgeFord, Kelsey N, PA-C  cetirizine (ZYRTEC) 10 MG tablet Take 1 tablet (10 mg total) by mouth daily. 03/01/18   Dartha LodgeFord, Kelsey N, PA-C  famotidine (PEPCID) 20 MG tablet Take 1 tablet (20 mg total) by mouth 2 (two) times daily. 05/23/18   Rolan BuccoBelfi, Ashon Rosenberg, MD  fluticasone (FLONASE) 50 MCG/ACT nasal spray Place 2 sprays into both nostrils daily. 03/01/18   Dartha LodgeFord, Kelsey N, PA-C  HYDROcodone-acetaminophen (NORCO/VICODIN) 5-325 MG tablet Take 1-2 tablets by mouth every 6 (six) hours as needed. 12/24/16   Roxy HorsemanBrowning, Robert, PA-C  lidocaine (LIDODERM) 5 % Place 1 patch onto the skin  daily. Remove & Discard patch within 12 hours or as directed by MD 10/06/17   Joy, Shawn C, PA-C  methocarbamol (ROBAXIN) 500 MG tablet Take 1 tablet (500 mg total) by mouth 2 (two) times daily. 10/06/17   Joy, Shawn C, PA-C  ondansetron (ZOFRAN ODT) 4 MG disintegrating tablet 4mg  ODT q4 hours prn nausea/vomit 05/23/18   Rolan BuccoBelfi, Jil Penland, MD  Oxymetazoline HCl (NASAL SPRAY) 0.05 % SOLN Place 2 drops into the nose daily as needed.    [provider]  Phenylephrine-APAP-Guaifenesin (MUCINEX FAST-MAX) 10-650-400 MG/20ML LIQD Take 20 mLs by mouth every 4 (four) hours as needed.    [provider]    Family History Family History  Problem Relation Age of Onset  . Mental illness Father   . Cancer Maternal Grandmother        unsure   . Diabetes Maternal Grandmother   . Hypertension Maternal Grandfather     Social History Social History   Tobacco Use  . Smoking status: Never Smoker  . Smokeless tobacco: Never Used  Substance Use Topics  . Alcohol use: Yes  . Drug use: Yes    Types: Marijuana     Allergies   Patient has no known allergies.   Review of Systems Review of Systems  Constitutional: Negative for chills, diaphoresis, fatigue and fever.  HENT: Negative for congestion, rhinorrhea and sneezing.  Eyes: Negative.   Respiratory: Negative for cough, chest tightness and shortness of breath.   Cardiovascular: Negative for chest pain and leg swelling.  Gastrointestinal: Positive for abdominal pain (Epigastric pain), diarrhea, nausea and vomiting. Negative for blood in stool.  Genitourinary: Negative for difficulty urinating, flank pain, frequency and hematuria.  Musculoskeletal: Negative for arthralgias and back pain.  Skin: Negative for rash.  Neurological: Negative for dizziness, speech difficulty, weakness, numbness and headaches.     Physical Exam Updated Vital Signs BP 127/79 (BP Location: Right Arm)   Pulse 60   Temp 97.8 F (36.6 C) (Oral)   Resp 18    Ht 5\' 7"  (1.702 m)   Wt 79.4 kg   SpO2 99%   BMI 27.41 kg/m   Physical Exam Constitutional:      Appearance: He is well-developed.  HENT:     Head: Normocephalic and atraumatic.  Eyes:     Pupils: Pupils are equal, round, and reactive to light.  Neck:     Musculoskeletal: Normal range of motion and neck supple.  Cardiovascular:     Rate and Rhythm: Normal rate and regular rhythm.     Heart sounds: Normal heart sounds.  Pulmonary:     Effort: Pulmonary effort is normal. No respiratory distress.     Breath sounds: Normal breath sounds. No wheezing or rales.  Chest:     Chest wall: No tenderness.  Abdominal:     General: Bowel sounds are normal.     Palpations: Abdomen is soft.     Tenderness: There is no abdominal tenderness. There is no guarding or rebound.  Musculoskeletal: Normal range of motion.  Lymphadenopathy:     Cervical: No cervical adenopathy.  Skin:    General: Skin is warm and dry.     Findings: No rash.  Neurological:     Mental Status: He is alert and oriented to person, place, and time.      ED Treatments / Results  Labs (all labs ordered are listed, but only abnormal results are displayed) Labs Reviewed  COMPREHENSIVE METABOLIC PANEL - Abnormal; Notable for the following components:      Result Value   Calcium 8.8 (*)    Alkaline Phosphatase 25 (*)    Total Bilirubin 2.9 (*)    All other components within normal limits  CBC WITH DIFFERENTIAL/PLATELET - Abnormal; Notable for the following components:   RBC 6.03 (*)    Neutro Abs 9.2 (*)    Lymphs Abs 0.3 (*)    All other components within normal limits  LIPASE, BLOOD - Abnormal; Notable for the following components:   Lipase 92 (*)    All other components within normal limits  URINALYSIS, ROUTINE W REFLEX MICROSCOPIC - Abnormal; Notable for the following components:   Ketones, ur 5 (*)    All other components within normal limits    EKG EKG Interpretation  Date/Time:  Sunday May 23 2018 12:18:08 EST Ventricular Rate:  64 PR Interval:  168 QRS Duration: 100 QT Interval:  384 QTC Calculation: 396 R Axis:   95 Text Interpretation:  Normal sinus rhythm Rightward axis Borderline ECG Confirmed by Rolan Bucco [phone]) on 05/23/2018 12:31:44 PM   Radiology No results found.  Procedures Procedures (including critical care time)  Medications Ordered in ED Medications  sodium chloride 0.9 % bolus 1,000 mL (0 mLs Intravenous Stopped 05/23/18 1357)  ondansetron (ZOFRAN) injection 4 mg (4 mg Intravenous Given 05/23/18 1224)  famotidine (PEPCID) IVPB 20 mg premix (  0 mg Intravenous Stopped 05/23/18 1357)     Initial Impression / Assessment and Plan / ED Course  I have reviewed the triage vital signs and the nursing notes.  Pertinent labs & imaging results that were available during my care of the patient were reviewed by me and considered in my medical decision making (see chart for details).     Patient is a 33 year old male who presents with vomiting and diarrhea that started this morning.  He had some associated burning in his epigastric area but no real pain on palpation of his epigastrium or right upper quadrant.  His lipase is mildly elevated but he does not really have significant tenderness over his pancreas.  There is no clinical concern for cholecystitis.  There is no pain in the right upper quadrant.  His other labs are non-concerning.  He feels better after treatment in the ED and is able to tolerate oral fluids.  He currently denies any pain.  He was discharged home in good condition.  He was advised to use clear liquids for the next 2 days and then gradually improve his diet after that.  He was given a prescription for Pepcid and Zofran.  Return precautions were given.  Final Clinical Impressions(s) / ED Diagnoses   Final diagnoses:  Nausea vomiting and diarrhea  Acute pancreatitis, unspecified complication status, unspecified pancreatitis type    ED  Discharge Orders         Ordered    famotidine (PEPCID) 20 MG tablet  2 times daily     05/23/18 1443    ondansetron (ZOFRAN ODT) 4 MG disintegrating tablet     05/23/18 1443           Rolan Bucco, MD 05/23/18 1458

## 2018-05-23 NOTE — ED Notes (Signed)
Pt given shower supplies- and escorted to shower due to homelessness and soiled himself.

## 2018-05-23 NOTE — Discharge Instructions (Signed)
Remain on clear liquids for the next 2 days.  Gradually increase your diet after that.  Follow-up with your primary care physician.  Return here if you have worsening symptoms.

## 2018-05-23 NOTE — ED Triage Notes (Signed)
Pt here for N/V/D since 6 am today, endorses epigastric pain.

## 2018-05-26 ENCOUNTER — Emergency Department (HOSPITAL_COMMUNITY)
Admission: EM | Admit: 2018-05-26 | Discharge: 2018-05-26 | Disposition: A | Discharge status: Home / Self Care | Payer: Medicaid Other | Attending: Emergency Medicine | Admitting: Emergency Medicine

## 2018-05-26 ENCOUNTER — Emergency Department (HOSPITAL_COMMUNITY): Payer: Medicaid Other

## 2018-05-26 ENCOUNTER — Encounter (HOSPITAL_COMMUNITY): Payer: Self-pay | Admitting: Emergency Medicine

## 2018-05-26 ENCOUNTER — Other Ambulatory Visit: Payer: Self-pay

## 2018-05-26 DIAGNOSIS — R1013 Epigastric pain: Secondary | ICD-10-CM | POA: Diagnosis present

## 2018-05-26 DIAGNOSIS — K529 Noninfective gastroenteritis and colitis, unspecified: Secondary | ICD-10-CM | POA: Insufficient documentation

## 2018-05-26 DIAGNOSIS — F209 Schizophrenia, unspecified: Secondary | ICD-10-CM | POA: Insufficient documentation

## 2018-05-26 LAB — TYPE AND SCREEN
ABO/RH(D): A POS
Antibody Screen: NEGATIVE

## 2018-05-26 LAB — CBC
HCT: 45.1 % (ref 39.0–52.0)
Hemoglobin: 14.7 g/dL (ref 13.0–17.0)
MCH: 27.7 pg (ref 26.0–34.0)
MCHC: 32.6 g/dL (ref 30.0–36.0)
MCV: 85.1 fL (ref 80.0–100.0)
NRBC: 0 % (ref 0.0–0.2)
Platelets: 134 10*3/uL — ABNORMAL LOW (ref 150–400)
RBC: 5.3 MIL/uL (ref 4.22–5.81)
RDW: 13.1 % (ref 11.5–15.5)
WBC: 4.5 10*3/uL (ref 4.0–10.5)

## 2018-05-26 LAB — COMPREHENSIVE METABOLIC PANEL
ALT: 21 U/L (ref 0–44)
AST: 26 U/L (ref 15–41)
Albumin: 3.8 g/dL (ref 3.5–5.0)
Alkaline Phosphatase: 25 U/L — ABNORMAL LOW (ref 38–126)
Anion gap: 6 (ref 5–15)
BILIRUBIN TOTAL: 1.4 mg/dL — AB (ref 0.3–1.2)
BUN: 6 mg/dL (ref 6–20)
CO2: 29 mmol/L (ref 22–32)
Calcium: 8.8 mg/dL — ABNORMAL LOW (ref 8.9–10.3)
Chloride: 106 mmol/L (ref 98–111)
Creatinine, Ser: 0.73 mg/dL (ref 0.61–1.24)
GFR calc Af Amer: >60 mL/min (ref >60)
GFR calc non Af Amer: >60 mL/min (ref >60)
Glucose, Bld: 85 mg/dL (ref 70–99)
Potassium: 3.6 mmol/L (ref 3.5–5.1)
Sodium: 141 mmol/L (ref 135–145)
Total Protein: 6.7 g/dL (ref 6.5–8.1)

## 2018-05-26 LAB — LIPASE, BLOOD: Lipase: 22 U/L (ref 11–51)

## 2018-05-26 LAB — ABO/RH: ABO/RH(D): A POS

## 2018-05-26 MED ORDER — IOHEXOL 300 MG/ML  SOLN
100.0000 mL | Freq: Once | INTRAMUSCULAR | Status: AC | PRN
  Administered 2018-05-26: 100 mL via INTRAVENOUS

## 2018-05-26 MED ORDER — FAMOTIDINE IN NACL 20-0.9 MG/50ML-% IV SOLN
20.0000 mg | Freq: Once | INTRAVENOUS | Status: AC
  Administered 2018-05-26: 20 mg via INTRAVENOUS
  Filled 2018-05-26: qty 50

## 2018-05-26 MED ORDER — ONDANSETRON HCL 4 MG/2ML IJ SOLN
4.0000 mg | Freq: Once | INTRAMUSCULAR | Status: AC
  Administered 2018-05-26: 4 mg via INTRAVENOUS
  Filled 2018-05-26: qty 2

## 2018-05-26 NOTE — ED Provider Notes (Cosign Needed)
[facility]   CSN: [id number] Arrival date & time: 05/26/18  1244    History   Chief Complaint Chief Complaint  Patient presents with  . Abdominal Pain  . Rectal Bleeding    HPI Jerl SantosMichael P Horger is a 33 y.o. male with a pmhx of schizophrenia, HLD who presented to the ED today complaining of abdominal pain, diarrhea and bloody stools. Pt reports that 2 days ago he developed acute onset N/V and diarrhea. NO blood noted in emesis or stool at that time. No recent antitbiotics or fevers. He was seen in the ED and discharged to home with supportive measure, felt to be gastroenteritis. Since that time his vomiting has resolved but he continues to have diarrhea. LAst night around midnight he had >4 episodes of diarrhea with streaks of BRB in it. He also notes epigastric and periumbilical pain with abdominal bloating. He denies any NSAID use, ETOH use. He has had limited PO intake due to his illness.      HPI  Past Medical History:  Diagnosis Date  . Hyperlipidemia 2015  . Schizophrenia [facility](HCC) 1996     Patient Active Problem List   Diagnosis Date Noted  . Smoker 12/30/2013  . Hyperlipidemia 06/04/2006  . OBESITY, NOS 06/04/2006  . Schizophrenia (HCC) 06/04/2006    Past Surgical History:  Procedure Laterality Date  . WISDOM TOOTH EXTRACTION  2000        Home Medications    Prior to Admission medications   Medication Sig Start Date End Date Taking? Authorizing Provider  amoxicillin-clavulanate (AUGMENTIN) 875-125 MG tablet Take 1 tablet by mouth 2 (two) times daily. One po bid x 7 days Patient not taking: Reported on 05/26/2018 03/01/18   Dartha LodgeFord, Kelsey N, PA-C  cetirizine (ZYRTEC) 10 MG tablet Take 1 tablet (10 mg total) by mouth daily. Patient not taking: Reported on 05/26/2018 03/01/18   Dartha LodgeFord, Kelsey N, PA-C  famotidine (PEPCID) 20 MG tablet Take 1 tablet (20 mg total) by mouth 2 (two) times daily. Patient not taking:  Reported on 05/26/2018 05/23/18   Rolan BuccoBelfi, Melanie, MD  fluticasone (FLONASE) 50 MCG/ACT nasal spray Place 2 sprays into both nostrils daily. Patient not taking: Reported on 05/26/2018 03/01/18   Dartha LodgeFord, Kelsey N, PA-C  HYDROcodone-acetaminophen (NORCO/VICODIN) 5-325 MG tablet Take 1-2 tablets by mouth every 6 (six) hours as needed. Patient not taking: Reported on 05/26/2018 12/24/16   Roxy HorsemanBrowning, Robert, PA-C  lidocaine (LIDODERM) 5 % Place 1 patch onto the skin daily. Remove & Discard patch within 12 hours or as directed by MD Patient not taking: Reported on 05/26/2018 10/06/17   Joy, Hillard DankerShawn C, PA-C  methocarbamol (ROBAXIN) 500 MG tablet Take 1 tablet (500 mg total) by mouth 2 (two) times daily. Patient not taking: Reported on 05/26/2018 10/06/17   Harolyn RutherfordJoy, Shawn C, PA-C  ondansetron (ZOFRAN ODT) 4 MG disintegrating tablet 4mg  ODT q4 hours prn nausea/vomit Patient not taking: Reported on 05/26/2018 05/23/18   Rolan BuccoBelfi, Melanie, MD    Family History Family History  Problem Relation Age of Onset  . Mental illness Father   . Cancer Maternal Grandmother        unsure   . Diabetes Maternal Grandmother   . Hypertension Maternal Grandfather     Social History Social History   Tobacco Use  . Smoking status: Never Smoker  . Smokeless tobacco: Never Used  Substance Use Topics  . Alcohol use: Yes  . Drug use: Yes    Types:  Marijuana     Allergies   Patient has no known allergies.   Review of Systems Review of Systems  All other systems reviewed and are negative.    Physical Exam Updated Vital Signs BP 122/64 (BP Location: Right Arm)   Pulse 75   Temp (!) 97.5 F (36.4 C) (Oral)   Resp 16   Ht 5\' 7"  (1.702 m)   Wt 79.4 kg   SpO2 97%   BMI 27.41 kg/m   Physical Exam Vitals signs and nursing note reviewed.  Constitutional:      General: He is not in acute distress.    Appearance: He is well-developed. He is not diaphoretic.  HENT:     Head: Normocephalic and atraumatic.     Mouth/Throat:       Pharynx: No oropharyngeal exudate.  Eyes:     General: No scleral icterus.       Right eye: No discharge.        Left eye: No discharge.     Conjunctiva/sclera: Conjunctivae normal.     Pupils: Pupils are equal, round, and reactive to light.  Cardiovascular:     Rate and Rhythm: Normal rate and regular rhythm.     Heart sounds: Normal heart sounds. No murmur. No friction rub. No gallop.   Pulmonary:     Effort: Pulmonary effort is normal. No respiratory distress.     Breath sounds: Normal breath sounds. No wheezing or rales.  Chest:     Chest wall: No tenderness.  Abdominal:     General: Bowel sounds are normal. There is no distension.     Palpations: Abdomen is soft.     Tenderness: There is abdominal tenderness in the epigastric area and periumbilical area. There is no guarding or rebound.  Genitourinary:    Rectum: Normal. Guaiac result negative. No tenderness.  Musculoskeletal: Normal range of motion.  Skin:    General: Skin is warm and dry.     Coloration: Skin is not pale.     Findings: No erythema or rash.  Neurological:     Mental Status: He is alert and oriented to person, place, and time.  Psychiatric:        Behavior: Behavior normal.      ED Treatments / Results  Labs (all labs ordered are listed, but only abnormal results are displayed) Labs Reviewed  COMPREHENSIVE METABOLIC PANEL - Abnormal; Notable for the following components:      Result Value   Calcium 8.8 (*)    Alkaline Phosphatase 25 (*)    Total Bilirubin 1.4 (*)    All other components within normal limits  CBC - Abnormal; Notable for the following components:   Platelets 134 (*)    All other components within normal limits  LIPASE, BLOOD  POC OCCULT BLOOD, ED  TYPE AND SCREEN  ABO/RH    EKG EKG Interpretation  Date/Time:  Wednesday May 26 2018 13:37:01 EST Ventricular Rate:  70 PR Interval:  162 QRS Duration: 106 QT Interval:  366 QTC Calculation: 395 R Axis:   77 Text  Interpretation:  Normal sinus rhythm with sinus arrhythmia Normal ECG flipped t waves in III now upright Otherwise no significant change Confirmed by Melene Plan [phone]) on 05/26/2018 2:54:27 PM   Radiology No results found.  Procedures Procedures (including critical care time)  Medications Ordered in ED Medications  famotidine (PEPCID) IVPB 20 mg premix (20 mg Intravenous New Bag/Given 05/26/18 1527)  ondansetron (ZOFRAN) injection 4 mg (4 mg  Intravenous Given 05/26/18 1527)     Initial Impression / Assessment and Plan / ED Course  I have reviewed the triage vital signs and the nursing notes.  Pertinent labs & imaging results that were available during my care of the patient were reviewed by me and considered in my medical decision making (see chart for details).        33 y.o with a pmhx of schizophrenia who presented to the ED with 3 days of GI upset. 2 days ago had N/V which has since resolved but continues to have diarrhea, now with some streaks of BRB in it. Also notes epigastric and periumbilical abdominal pain and bloating. Abdominal exam with rebound or guarding, soft. CBC, CMP and lipase are unremarkable which is reassuring. Will obtain CT A/P to r/o colitis.   Pt signed out to [facility] PA-C at shift change pending CT.   Final Clinical Impressions(s) / ED Diagnoses   Final diagnoses:  None    ED Discharge Orders    None       Dub Mikes, PA-C 05/26/18 1550

## 2018-05-26 NOTE — ED Notes (Signed)
Per staff patient eloped prior to discharge with NSL in arm. Attempted to contact by phone and no answer

## 2018-05-26 NOTE — Discharge Instructions (Signed)
It was my pleasure taking care of you today!   Please call your primary care doctor today or first thing in the morning to schedule a follow up appointment.   Continue taking Pepcid.  Use Zofran as needed for nausea.   Fortunately, your lab work and imaging was very reassuring today. You should get better in a few days, however is VERY important that you monitor your symptoms and return to the Emergency Department if you develop any of the following symptoms:  You develop a fever.  You keep throwing up and can't keep fluids down. New or worsening symptoms develop. You have any questions or concerns.

## 2018-05-26 NOTE — ED Notes (Signed)
No answer x2 

## 2018-05-26 NOTE — ED Triage Notes (Signed)
Pt reports he started having abd pains and bloody stools that started this morning. Pt stated he had diarrhea 3 days ago and was seen for same today.
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
## 2018-09-21 ENCOUNTER — Emergency Department (HOSPITAL_COMMUNITY)
Admission: EM | Admit: 2018-09-21 | Discharge: 2018-09-21 | Disposition: A | Discharge status: Home / Self Care | Payer: Medicaid Other | Attending: Emergency Medicine | Admitting: Emergency Medicine

## 2018-09-21 ENCOUNTER — Other Ambulatory Visit: Payer: Self-pay

## 2018-09-21 DIAGNOSIS — Z5321 Procedure and treatment not carried out due to patient leaving prior to being seen by health care provider: Secondary | ICD-10-CM | POA: Diagnosis not present

## 2018-09-21 DIAGNOSIS — M79643 Pain in unspecified hand: Secondary | ICD-10-CM | POA: Diagnosis present

## 2018-09-21 NOTE — ED Triage Notes (Signed)
Pt reports c/o of right hand pain denies any injury- no deformity noted.

## 2018-10-29 ENCOUNTER — Other Ambulatory Visit: Payer: Self-pay

## 2018-10-29 DIAGNOSIS — Z20822 Contact with and (suspected) exposure to covid-19: Secondary | ICD-10-CM

## 2018-11-06 LAB — NOVEL CORONAVIRUS, NAA: SARS-CoV-2, NAA: NOT DETECTED

## 2020-04-29 IMAGING — CR DG CHEST 2V
2 series · 2 of 2 positions shown · non-contrast
Comparison: September 30, 2016

CLINICAL DATA: Cough and shortness of breath

EXAM:
CHEST - 2 VIEW

[chest pa]
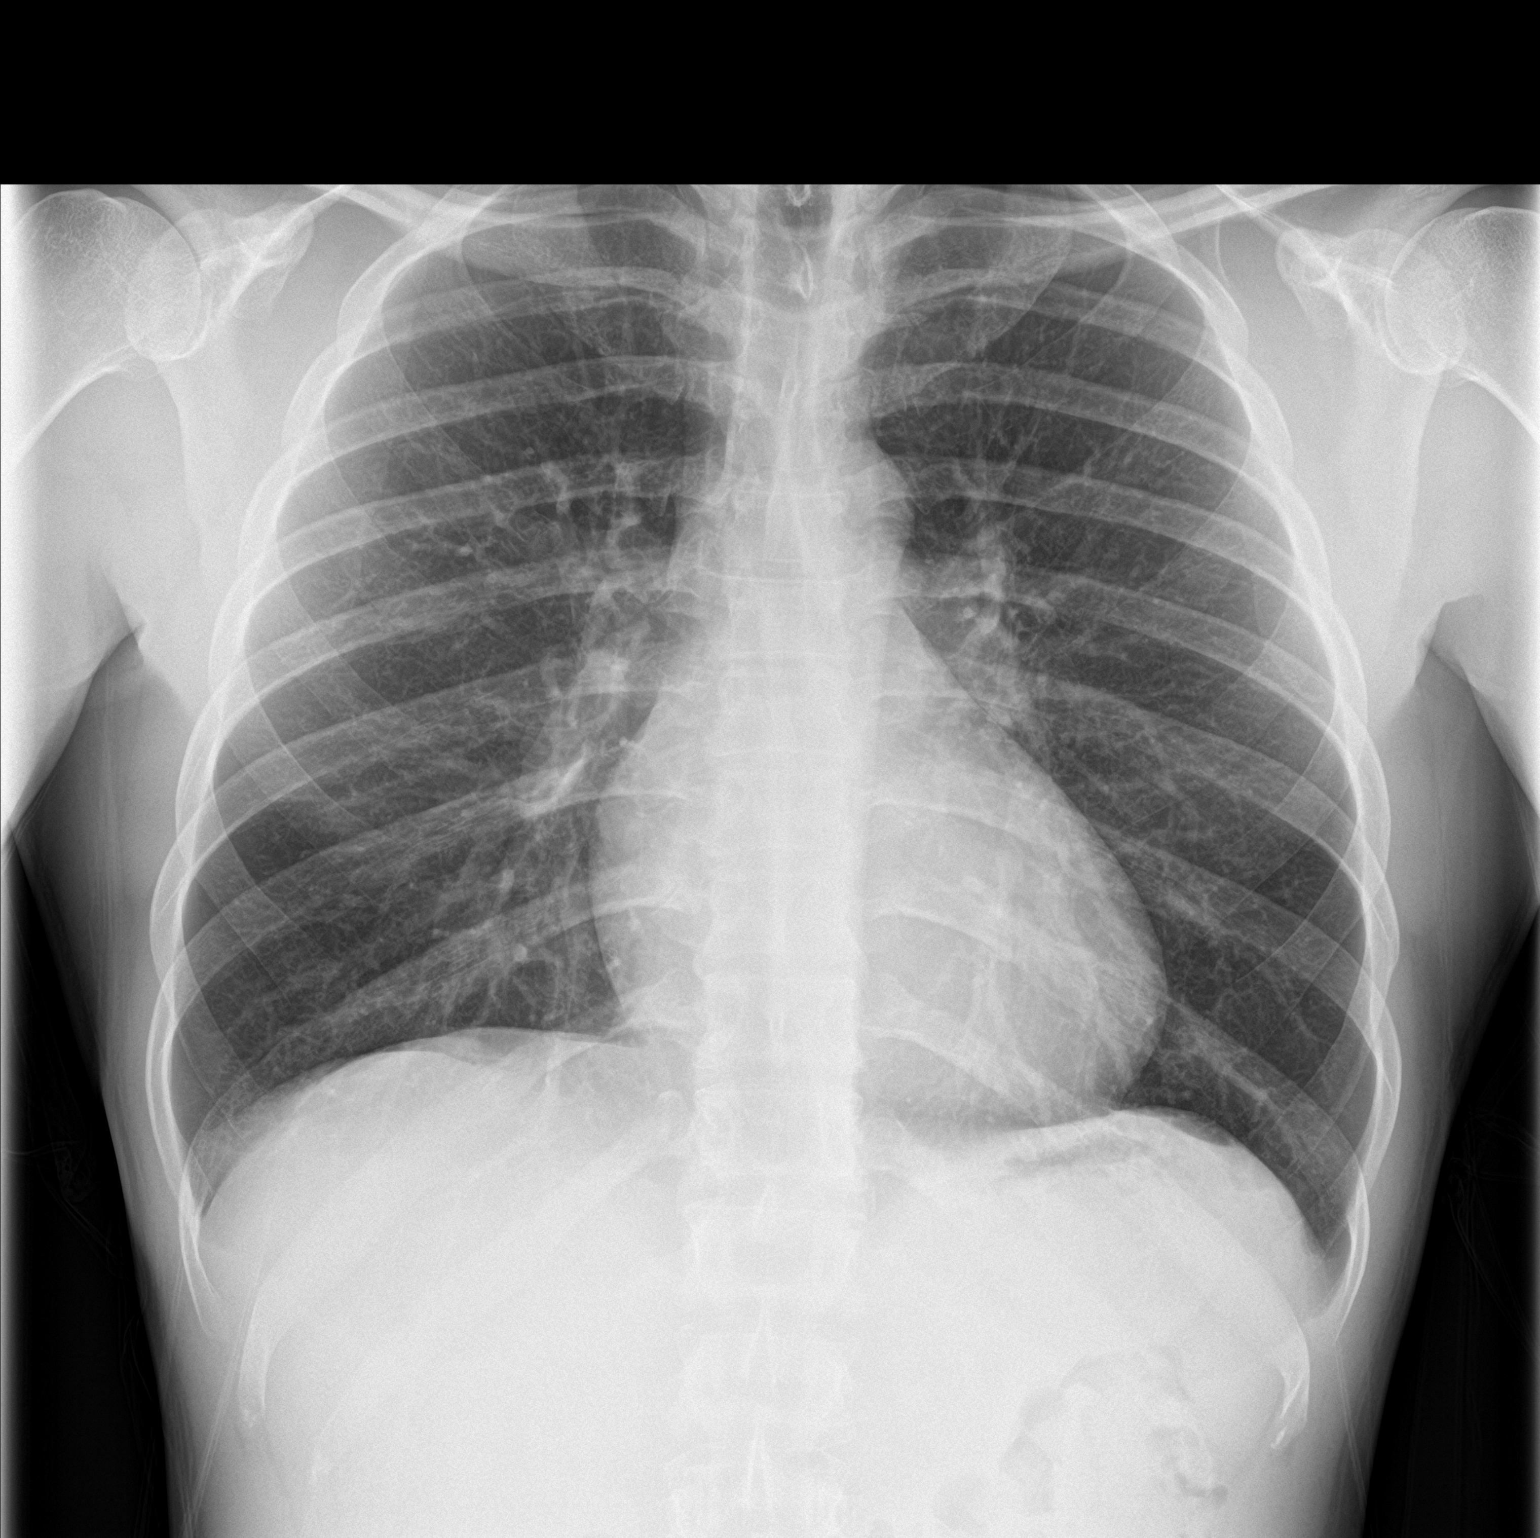

[chest lat]
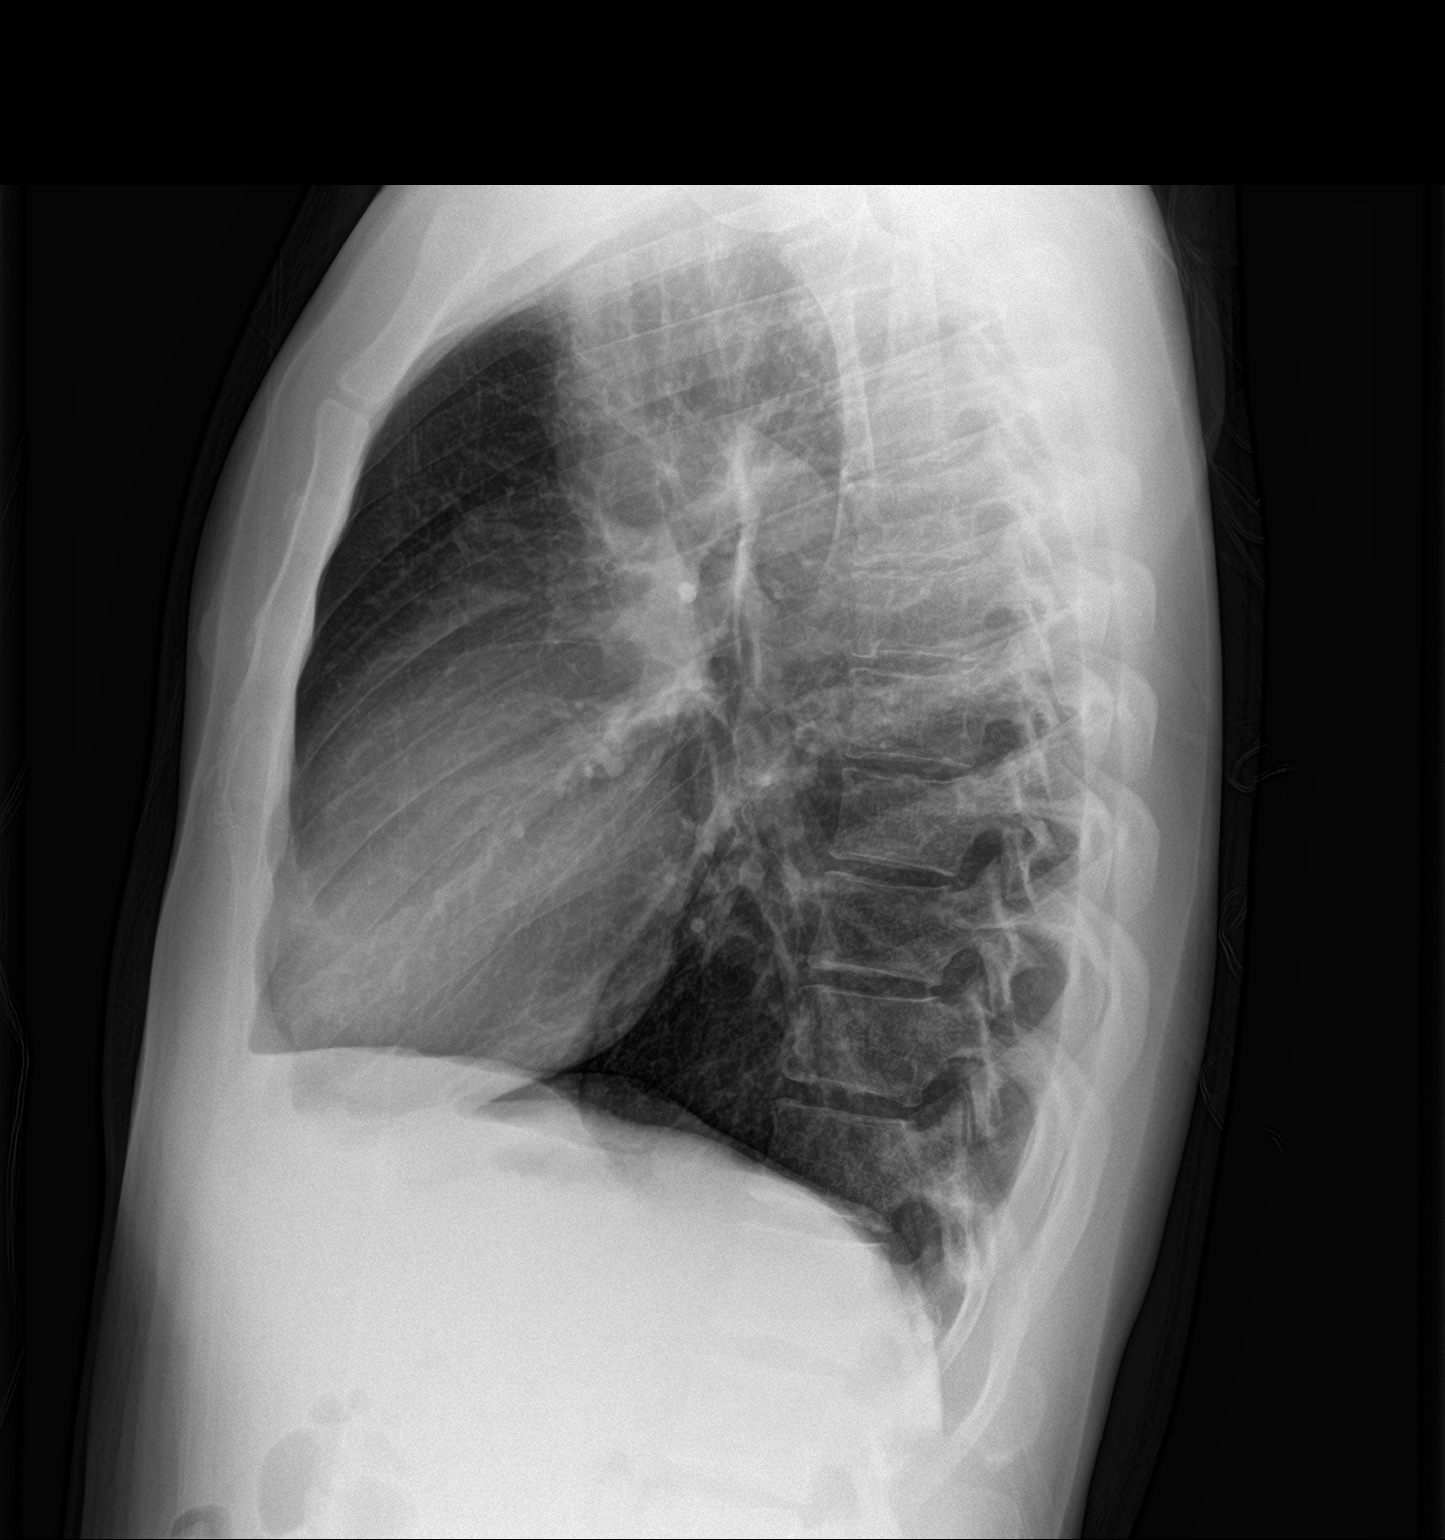

[2 of 2 positions shown; findings below may reference images not displayed]

FINDINGS: No edema or consolidation. Heart size and pulmonary vascularity are
normal. No adenopathy. No pneumothorax. No bone lesions.
IMPRESSION: No edema or consolidation.

## 2020-07-24 IMAGING — CT CT ABD-PELV W/ CM
2 of 4 series · 16 of 46 positions shown, 18 images · IV contrast (omnipaque)
Comparison: None.

CLINICAL DATA: Acute abdominal pain and bloody stools this morning.
Diarrhea 3 days ago.

EXAM:
CT ABDOMEN AND PELVIS WITH CONTRAST
TECHNIQUE: Multidetector CT imaging of the abdomen and pelvis was performed
using the standard protocol following bolus administration of
intravenous contrast.
CONTRAST:  100mL OMNIPAQUE IOHEXOL 300 MG/ML  SOLN

[Series 3: a/p w/ 5mm · axial · 0.69mm/px · z∈[+762,+1152]mm · 13 of 86 slices shown, 15 images]
[im 4/86  soft-tissue]
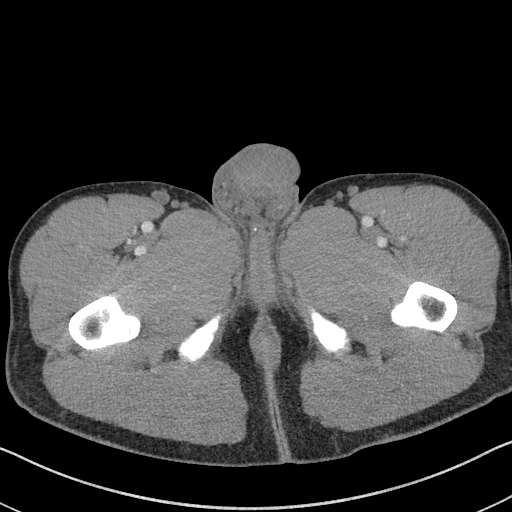
[im 4/86  bone]
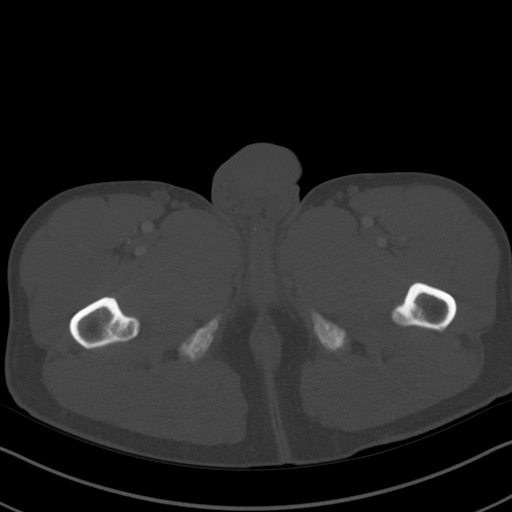
[im 10/86  soft-tissue]
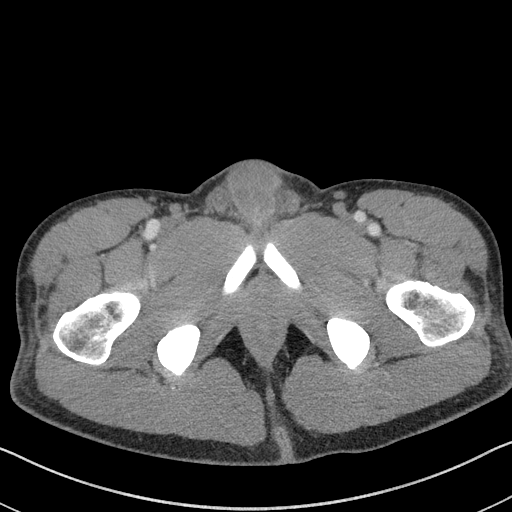
[im 17/86  soft-tissue]
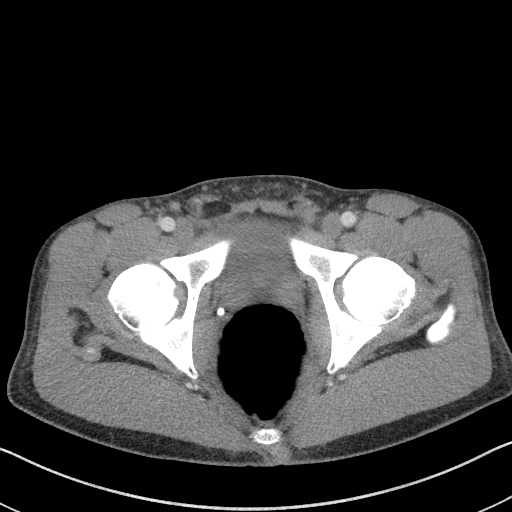
[im 23/86  soft-tissue]
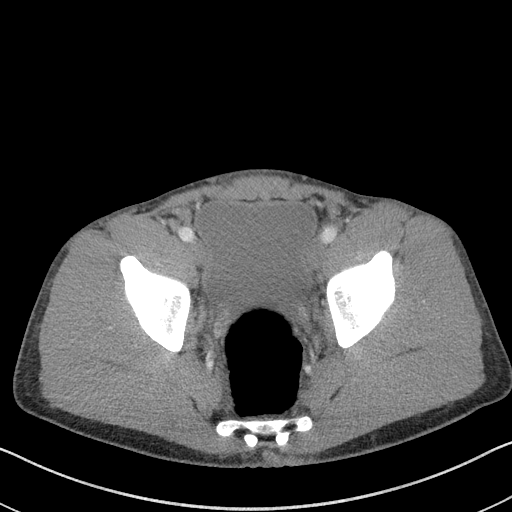
[im 30/86  soft-tissue]
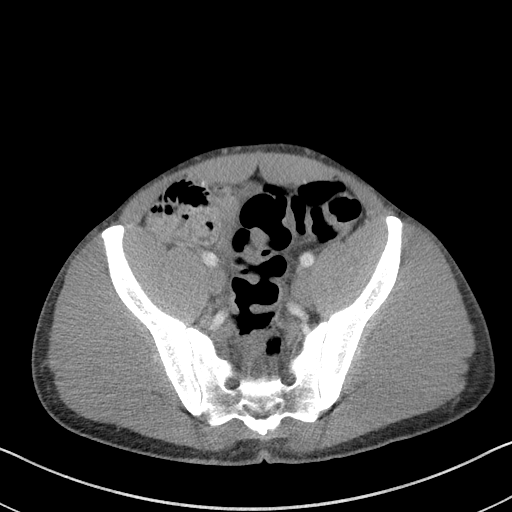
[im 36/86  soft-tissue]
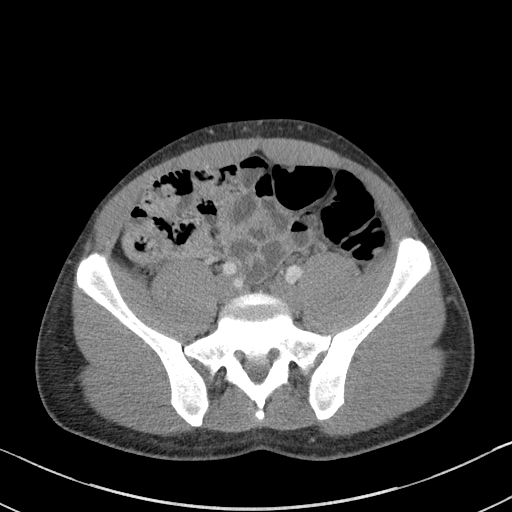
[im 43/86  soft-tissue]
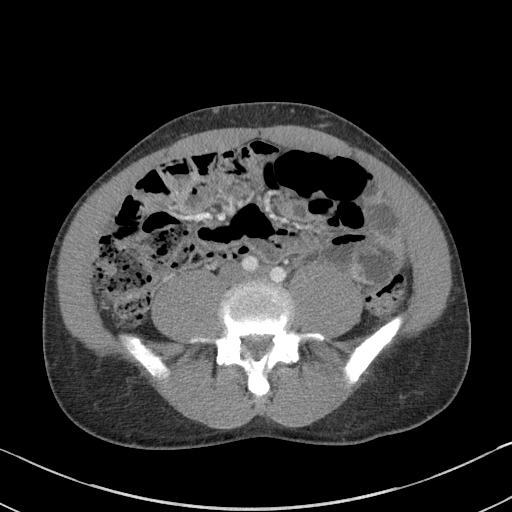
[im 50/86  soft-tissue]
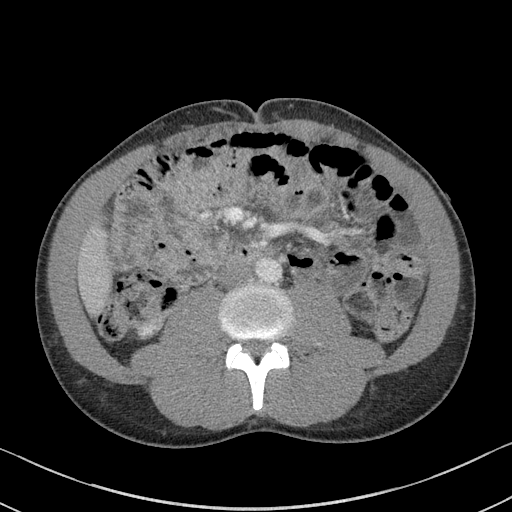
[im 56/86  soft-tissue]
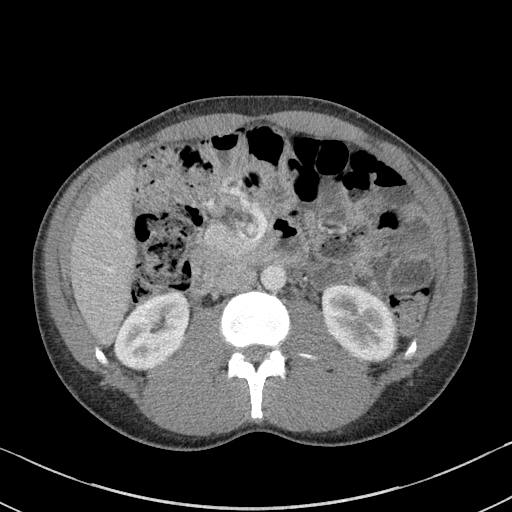
[im 56/86  bone]
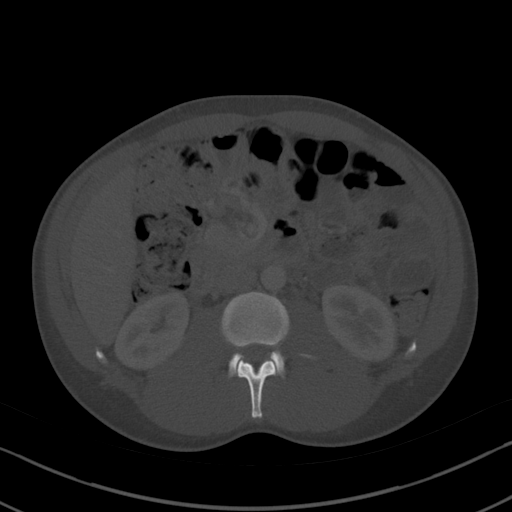
[im 63/86  soft-tissue]
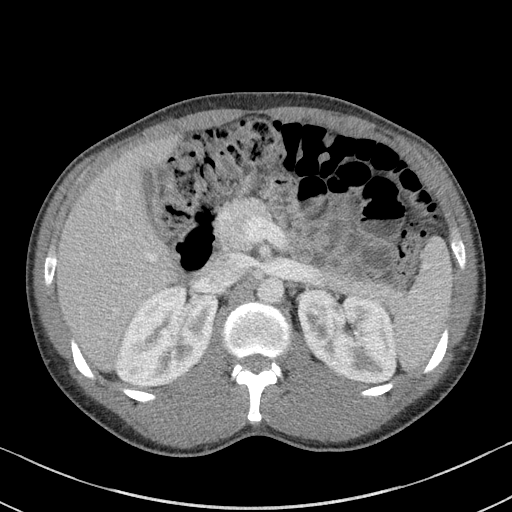
[im 69/86  soft-tissue]
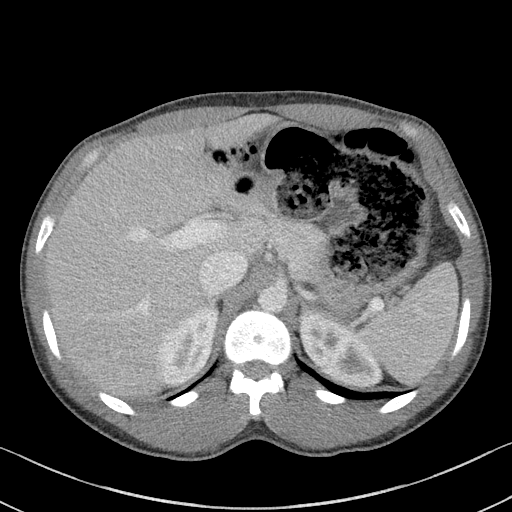
[im 76/86  soft-tissue]
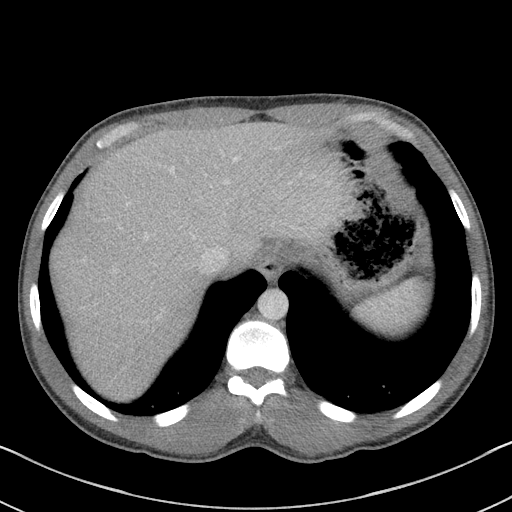
[im 82/86  soft-tissue]
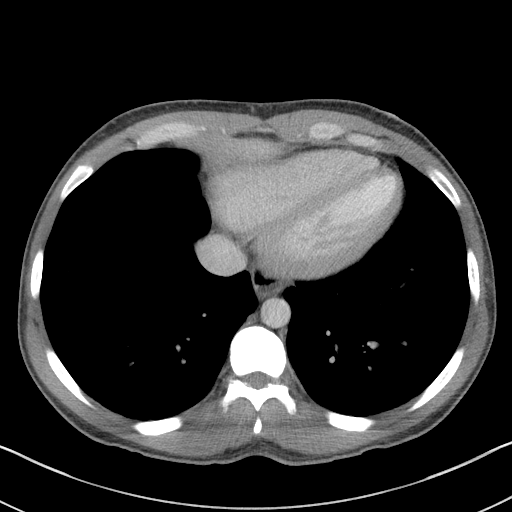

[Series 6: a/p w/ cor · coronal · 0.70mm/px · 3 of 138 slices shown]
[im 46/138  soft-tissue]
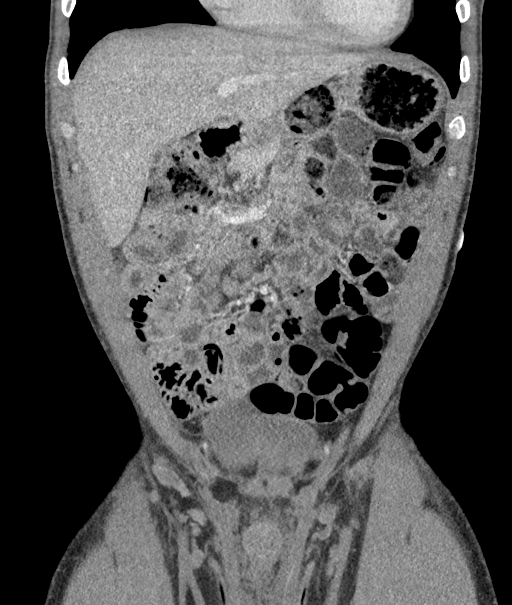
[im 61/138  soft-tissue]
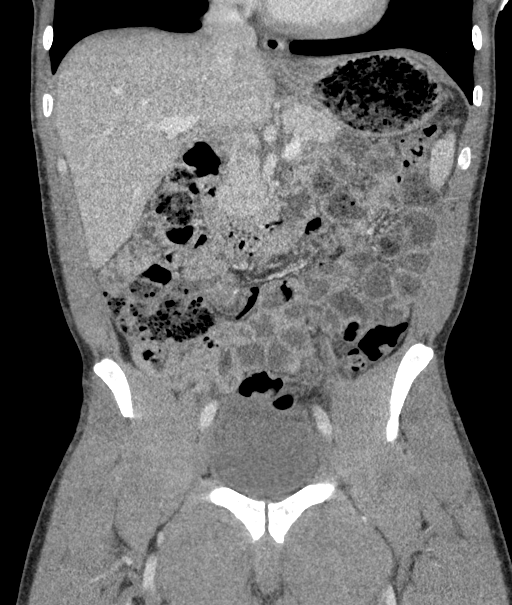
[im 77/138  soft-tissue]
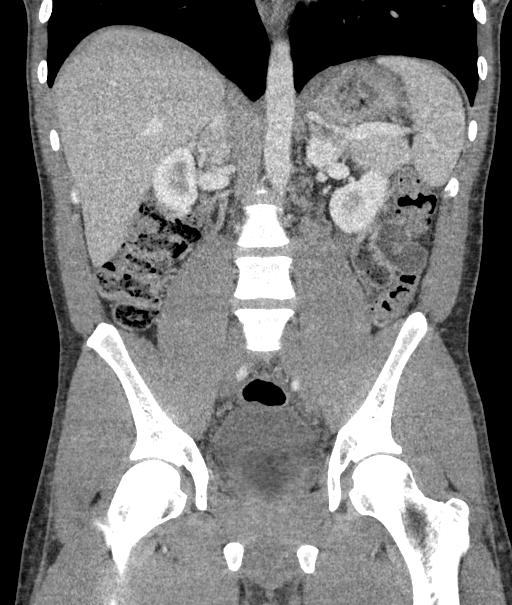

[16 of 46 positions shown; findings below may reference images not displayed]

FINDINGS: Lower chest: No acute abnormality.

Hepatobiliary: Decompressed gallbladder. Homogeneous attenuation of
the liver without mass or biliary dilatation.

Pancreas: Unremarkable. No pancreatic ductal dilatation or
surrounding inflammatory changes.

Spleen: Normal in size without focal abnormality.

Adrenals/Urinary Tract: Adrenal glands are unremarkable. Kidneys are
normal, without renal calculi, focal lesion, or hydronephrosis.
Bladder is unremarkable.

Stomach/Bowel: Small hiatal hernia. The stomach is physiologically
distended with ingested food. Mild fluid-filled distention of small
bowel without mechanical bowel obstruction. Moderate stool retention
within the colon. Air-filled normal appearing appendix.

Vascular/Lymphatic: No significant vascular findings are present. No
enlarged abdominal or pelvic lymph nodes.

Reproductive: Prostate is unremarkable.

Other: No abdominal wall hernia or abnormality. No abdominopelvic
ascites.

Musculoskeletal: No acute or significant osseous findings.
IMPRESSION: Mild fluid-filled distention of small bowel without mechanical bowel
obstruction or inflammation. Findings may represent a mild
enteritis.

## 2020-08-17 ENCOUNTER — Other Ambulatory Visit: Payer: Self-pay

## 2020-08-17 ENCOUNTER — Emergency Department (HOSPITAL_COMMUNITY)
Admission: EM | Admit: 2020-08-17 | Discharge: 2020-08-17 | Disposition: A | Discharge status: Home / Self Care | Attending: Emergency Medicine | Admitting: Emergency Medicine

## 2020-08-17 ENCOUNTER — Encounter (HOSPITAL_COMMUNITY): Payer: Self-pay | Admitting: Emergency Medicine

## 2020-08-17 DIAGNOSIS — M791 Myalgia, unspecified site: Secondary | ICD-10-CM | POA: Diagnosis not present

## 2020-08-17 DIAGNOSIS — Y31XXXA Falling, lying or running before or into moving object, undetermined intent, initial encounter: Secondary | ICD-10-CM | POA: Diagnosis not present

## 2020-08-17 DIAGNOSIS — S01411A Laceration without foreign body of right cheek and temporomandibular area, initial encounter: Secondary | ICD-10-CM | POA: Diagnosis not present

## 2020-08-17 DIAGNOSIS — M7918 Myalgia, other site: Secondary | ICD-10-CM

## 2020-08-17 DIAGNOSIS — S0181XA Laceration without foreign body of other part of head, initial encounter: Secondary | ICD-10-CM

## 2020-08-17 DIAGNOSIS — Y92149 Unspecified place in prison as the place of occurrence of the external cause: Secondary | ICD-10-CM | POA: Insufficient documentation

## 2020-08-17 DIAGNOSIS — F17200 Nicotine dependence, unspecified, uncomplicated: Secondary | ICD-10-CM | POA: Insufficient documentation

## 2020-08-17 DIAGNOSIS — S0993XA Unspecified injury of face, initial encounter: Secondary | ICD-10-CM | POA: Diagnosis present
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
